# Patient Record
Sex: Female | Born: 1965 | Hispanic: Yes | Marital: Married
Health system: Midwestern US, Community
[De-identification: ages and names within clinical notes are randomized; demographics above are authoritative.]

## PROBLEM LIST (undated history)

## (undated) DIAGNOSIS — E785 Hyperlipidemia, unspecified: Secondary | ICD-10-CM

## (undated) DIAGNOSIS — K219 Gastro-esophageal reflux disease without esophagitis: Secondary | ICD-10-CM

## (undated) DIAGNOSIS — I2542 Coronary artery dissection: Secondary | ICD-10-CM

## (undated) DIAGNOSIS — F419 Anxiety disorder, unspecified: Secondary | ICD-10-CM

## (undated) DIAGNOSIS — R112 Nausea with vomiting, unspecified: Secondary | ICD-10-CM

## (undated) DIAGNOSIS — T4145XA Adverse effect of unspecified anesthetic, initial encounter: Secondary | ICD-10-CM

## (undated) DIAGNOSIS — I10 Essential (primary) hypertension: Secondary | ICD-10-CM

## (undated) DIAGNOSIS — T8859XA Other complications of anesthesia, initial encounter: Secondary | ICD-10-CM

## (undated) DIAGNOSIS — E039 Hypothyroidism, unspecified: Secondary | ICD-10-CM

## (undated) DIAGNOSIS — I219 Acute myocardial infarction, unspecified: Secondary | ICD-10-CM

## (undated) DIAGNOSIS — Z9889 Other specified postprocedural states: Secondary | ICD-10-CM

## (undated) DIAGNOSIS — E079 Disorder of thyroid, unspecified: Secondary | ICD-10-CM

## (undated) DIAGNOSIS — S46919A Strain of unspecified muscle, fascia and tendon at shoulder and upper arm level, unspecified arm, initial encounter: Secondary | ICD-10-CM

## (undated) DIAGNOSIS — Z1231 Encounter for screening mammogram for malignant neoplasm of breast: Secondary | ICD-10-CM

## (undated) DIAGNOSIS — M8580 Other specified disorders of bone density and structure, unspecified site: Secondary | ICD-10-CM

## (undated) DIAGNOSIS — R928 Other abnormal and inconclusive findings on diagnostic imaging of breast: Secondary | ICD-10-CM

## (undated) DIAGNOSIS — M654 Radial styloid tenosynovitis [de Quervain]: Secondary | ICD-10-CM

## (undated) DIAGNOSIS — M75101 Unspecified rotator cuff tear or rupture of right shoulder, not specified as traumatic: Secondary | ICD-10-CM

## (undated) HISTORY — PX: TUBAL LIGATION: SHX77

## (undated) HISTORY — DX: Disorder of thyroid, unspecified: E07.9

## (undated) HISTORY — DX: Anxiety disorder, unspecified: F41.9

## (undated) HISTORY — DX: Coronary artery dissection: I25.42

## (undated) HISTORY — DX: Acute myocardial infarction, unspecified: I21.9

## (undated) HISTORY — DX: Hyperlipidemia, unspecified: E78.5

---

## 1996-07-07 HISTORY — PX: OTHER SURGICAL HISTORY: SHX169

## 2004-04-24 ENCOUNTER — Other Ambulatory Visit: Admission: RE | Admit: 2004-04-24 | Discharge: 2004-04-24 | Payer: Self-pay | Admitting: Obstetrics and Gynecology

## 2004-07-19 ENCOUNTER — Encounter: Admission: RE | Admit: 2004-07-19 | Discharge: 2004-07-19 | Payer: Self-pay | Admitting: Sports Medicine

## 2004-07-19 ENCOUNTER — Ambulatory Visit: Payer: Self-pay | Admitting: Sports Medicine

## 2005-03-28 ENCOUNTER — Emergency Department (HOSPITAL_COMMUNITY): Admission: EM | Admit: 2005-03-28 | Discharge: 2005-03-28 | Payer: Self-pay | Admitting: Family Medicine

## 2005-04-09 ENCOUNTER — Ambulatory Visit: Payer: Self-pay | Admitting: Sports Medicine

## 2005-06-10 ENCOUNTER — Ambulatory Visit (HOSPITAL_COMMUNITY): Admission: RE | Admit: 2005-06-10 | Discharge: 2005-06-10 | Payer: Self-pay | Admitting: Orthopedic Surgery

## 2005-07-02 ENCOUNTER — Ambulatory Visit: Payer: Self-pay | Admitting: Sports Medicine

## 2012-01-28 MED ORDER — TRIMETHOPRIM-POLYMYXIN B 0.1 %-10,000 UNIT/ML EYE DROPS
10000 unit- 1 mg/mL | OPHTHALMIC | Status: AC
Start: 2012-01-28 — End: 2012-02-07

## 2012-01-28 NOTE — Progress Notes (Signed)
Subjective:   Kristina Mills is a 46 y.o. female who presents for evaluation of redness, photophobia. She has noticed the above symptoms in the bilateral eye for 2 days.  Onset was acute.   Symptoms have included discharge, photophobia, itching. Patient denies blurred vision, visual field deficit. There is a history of other family members with similar symptoms    Review of Systems  Eyes: discharge, itching    Objective:   BP 132/79   Pulse 59   Temp(Src) 98.3 ??F (36.8 ??C) (Oral)   Resp 18   Ht 5' 0.5" (1.537 m)   Wt 148 lb (67.132 kg)   BMI 28.43 kg/m2   SpO2 99%              General: alert, cooperative, no distress   Eyes:  positive findings: conjunctivae: 1+ injection or sclera erythematous bilaterally   Vision: See above   Fluorescein:  not done     Assessment/Plan:     acute conjunctivitis    1. Discussed the diagnosis and proper care of conjunctivitis.  Stressed household Presenter, broadcasting.  2. Ophthalmic drops per orders.  3. Patient instructions: contagiousness precautions  4. Risks and side effects of medications was discussed.    5. Pt advised to read written information provided by the pharmacist: yes  6. Followup as needed.    1. Conjunctivitis of both eyes      Discussed likelihood of being viral but will cover with antibiotic drop  Work not to return tomorrow

## 2012-01-28 NOTE — Patient Instructions (Signed)
Pinkeye: After Your Visit  Your Care Instructions  Pinkeye is redness and swelling of the eye surface and the conjunctiva (the lining of the eyelid and the covering of the white part of the eye). Pinkeye is also called conjunctivitis. Pinkeye is often caused by infection with bacteria or a virus. Dry air, allergies, smoke, and chemicals are other common causes.  Pinkeye often clears on its own in 7 to 10 days. Antibiotics only help if the pinkeye is caused by bacteria. Pinkeye caused by infection spreads easily. If an allergy or chemical is causing pinkeye, it will not go away unless you can avoid whatever is causing it.  Follow-up care is a key part of your treatment and safety. Be sure to make and go to all appointments, and call your doctor if you are having problems. It???s also a good idea to know your test results and keep a list of the medicines you take.  How can you care for yourself at home?  ?? Wash your hands often. Always wash them before and after you treat pinkeye or touch your eyes or face.   ?? Use moist cotton or a clean, wet cloth to remove crust. Wipe from the inside corner of the eye to the outside. Use a clean part of the cloth for each wipe.   ?? Put cold or warm wet cloths on your eye a few times a day if the eye hurts.   ?? Do not wear contact lenses or eye makeup until the pinkeye is gone. Throw away any eye makeup you were using when you got pinkeye. Clean your contacts and storage case. If you wear disposable contacts, use a new pair when your eye has cleared and it is safe to wear contacts again.   ?? If the doctor gave you antibiotic ointment or eyedrops, use them as directed. Use the medicine for as long as instructed, even if your eye starts looking better soon. Keep the bottle tip clean, and do not let it touch the eye area.   ?? To put in eyedrops or ointment:   ?? Tilt your head back, and pull your lower eyelid down with one finger.   ?? Drop or squirt the medicine inside the lower lid.    ?? Close your eye for 30 to 60 seconds to let the drops or ointment move around.   ?? Do not touch the ointment or dropper tip to your eyelashes or any other surface.   ?? Do not share towels, pillows, or washcloths while you have pinkeye.   When should you call for help?  Call your doctor now or seek immediate medical care if:  ?? You have pain in your eye, not just irritation on the surface.   ?? You have a change in vision or loss of vision.   ?? You have an increase in discharge from the eye.   ?? Your eye has not started to improve or begins to get worse within 48 hours after you start using antibiotics.   ?? Pinkeye lasts longer than 7 days.   Watch closely for changes in your health, and be sure to contact your doctor if you have any problems.    Where can you learn more?    Go to http://www.healthwise.net/[facility]   Enter Y392 in the search box to learn more about "Pinkeye: After Your Visit."    ?? 2006-2013 Health[employer], [employer]. Care instructions adapted under license by [facility] (which disclaims liability or warranty for this   information). This care instruction is for use with your licensed healthcare professional. If you have questions about a medical condition or this instruction, always ask your healthcare professional. Healthwise, Incorporated disclaims any warranty or liability for your use of this information.  Content Version: 9.7.130178; Last Revised: September 19, 2010

## 2012-01-28 NOTE — Progress Notes (Signed)
Chief Complaint   Patient presents with   ??? Eye Problem     drainage from right eye x 3 days

## 2012-03-29 ENCOUNTER — Encounter

## 2012-04-27 ENCOUNTER — Encounter

## 2012-05-06 ENCOUNTER — Encounter

## 2012-06-10 NOTE — Progress Notes (Signed)
[facility]  PREOPERATIVE INSTRUCTIONS     Surgery Date:   12/11                                          SURgery arrival time given by surgeon:no     If ???no???,[facility] staff will call you between 4 PM- 8 PM the day before surgery with    your arrival time. If your surgery is on a Monday, we will call you the preceding Friday.       Please call [phone] after 8 PM if you did not receive your arrival time.  Use our free valet parking and do not tip  1. Please report at the designated time to the 2nd Floor Admitting Desk. Bring insurance card and pictrue ID  2. You must have a responsible adult to drive you home. You need to have a responsible adult to stay with you the first 24 hours after surgery if you are going home the same day of your surgery and you should not drive a car for 24 hours following your surgery.  3. Nothing to eat or drink after midnight the night before surgery. This includes no water, gum, mints, coffee, juice, etc.  Please note special instructions, if applicable, below for medications.  4. MEDICATIONS TO TAKE THE MORNING OF SURGERY WITH A SIP OF WATER:famotidine, celexa, lisinopril   No alcoholic beverages 24 hours before or after your surgery.  5. If you are being admitted to the hospital,please leave personal belongings/luggage in your car until you have an assigned hospital room number.  6. Stop Aspirin and/or any non-steroidal anti-inflammatory drugs (i.e. Ibuprofen, Naproxen, Advil, Aleve) as directed by your surgeon.  You may take Tylenol.        Stop herbal supplements 1 week prior to  surgery.  7. If you are currently taking Plavix, Coumadin,or any other blood-thinning/anticoagulant medication contact your surgeon for instructions.  8. Please wear comfortable clothes. Wear your glasses instead of contacts. We ask that all money, jewelry and valuables be left at home. Wear no make up, particularly mascara, the day of surgery.   9.  All body piercings, rings,and jewelry need to be removed and left  at home.    Please wear your hair loose or down. Please no pony-tails, buns, or any metal hair accessories. If you shower the morning of surgery, please do not apply any lotions, powders, or deodorants afterwards.   Do not shave any body area within 24 hours of your surgery.  10. Please follow all instructions to avoid any potential surgical cancellation.  11.  Should your physical condition change, (i.e. fever, cold, flu, etc.) please notify your surgeon as soon as possible.  12. It is important to be on time. If a situation occurs where you may be delayed, please call:  [phone] / [phone] on the day of surgery.  13. The Preadmission Testing staff can be reached at [phone]..  14. Special instructions:none    The patient was contacted by phone   SHE  verbalized  understanding of all instructions does not  need reinforcement.

## 2012-06-10 NOTE — Progress Notes (Signed)
[facility]  PREOPERATIVE INSTRUCTIONS    Surgery Date:   12/11  Surgery arrival time given by surgeon: no     If ???no???,[facility] staff will call you between 4 PM- 8 PM the day before surgery with    your arrival time. If your surgery is on a Monday, we will call you the preceding Friday.       Please call [phone] after 8 PM if you did not receive your arrival time.  Please use valet parking which is free and do not tip  1. Please report at the designated time to the 2nd Floor Admitting Desk. Bring insurance card and pictrue ID  2. You must have a responsible adult to drive you home. You need to have a responsible adult to stay with you the first 24 hours after surgery if you are going home the same day of your surgery and you should not drive a car for 24 hours following your surgery.  3. Nothing to eat or drink after midnight the night before surgery. This includes no water, gum, mints, coffee, juice, etc.  Please note special instructions, if applicable, below for medications.  4. MEDICATIONS TO TAKE THE MORNING OF SURGERY WITH A SIP OF WATER:famotidine, lisinopril,celexa  5. No alcoholic beverages 24 hours before or after your surgery.  6. If you are being admitted to the hospital,please leave personal belongings/luggage in your car until you have an assigned hospital room number.  7. Stop Aspirin and/or any non-steroidal anti-inflammatory drugs (i.e. Ibuprofen, Naproxen, Advil, Aleve) as directed by your surgeon.  You may take Tylenol.        Stop herbal supplements 1 week prior to  surgery.  8. If you are currently taking Plavix, Coumadin,or any other blood-thinning/anticoagulant medication contact your surgeon for instructions.  9. Please wear comfortable clothes. Wear your glasses instead of contacts. We ask that all money, jewelry and valuables be left at home. Wear no make up, particularly mascara, the day of surgery.   10.  All body piercings, rings,and jewelry need to be removed and left at home.    Please wear  your hair loose or down. Please no pony-tails, buns, or any metal hair accessories. If you shower the morning of surgery, please do not apply any lotions, powders, or deodorants afterwards.   Do not shave any body area within 24 hours of your surgery.  11. Please follow all instructions to avoid any potential surgical cancellation.  12.  Should your physical condition change, (i.e. fever, cold, flu, etc.) please notify your surgeon as soon as possible.  13. It is important to be on time. If a situation occurs where you may be delayed, please call:  [phone] / [phone] on the day of surgery.  14. The Preadmission Testing staff can be reached at [phone]..  15. Special instructions:none    The patient was contacted  By phone.   SHE  verbalized  understanding of all instructions does not  need reinforcement.

## 2012-06-16 ENCOUNTER — Inpatient Hospital Stay: Payer: PRIVATE HEALTH INSURANCE

## 2012-06-16 MED ORDER — BACITRACIN 50,000 UNIT IM
50000 unit | INTRAMUSCULAR | Status: AC
Start: 2012-06-16 — End: ?

## 2012-06-16 MED ORDER — LACTATED RINGERS IV
INTRAVENOUS | Status: DC
Start: 2012-06-16 — End: 2012-06-16
  Administered 2012-06-16: 19:00:00 via INTRAVENOUS

## 2012-06-16 MED ORDER — SODIUM CHLORIDE 0.9 % IJ SYRG
INTRAMUSCULAR | Status: DC | PRN
Start: 2012-06-16 — End: 2012-06-16

## 2012-06-16 MED ORDER — FENTANYL CITRATE (PF) 50 MCG/ML IJ SOLN
50 mcg/mL | INTRAMUSCULAR | Status: AC
Start: 2012-06-16 — End: ?

## 2012-06-16 MED ORDER — LIDOCAINE HCL 2 % (20 MG/ML) IJ SOLN
20 mg/mL (2 %) | INTRAMUSCULAR | Status: AC
Start: 2012-06-16 — End: ?

## 2012-06-16 MED ORDER — HYDROCODONE-ACETAMINOPHEN 5 MG-325 MG TAB
5-325 mg | ORAL_TABLET | ORAL | Status: DC
Start: 2012-06-16 — End: 2014-06-26

## 2012-06-16 MED ORDER — ONDANSETRON (PF) 4 MG/2 ML INJECTION
4 mg/2 mL | INTRAMUSCULAR | Status: DC | PRN
Start: 2012-06-16 — End: 2012-06-16

## 2012-06-16 MED ORDER — BUPIVACAINE (PF) 0.25 % (2.5 MG/ML) IJ SOLN
0.25 % (2.5 mg/mL) | INTRAMUSCULAR | Status: DC | PRN
Start: 2012-06-16 — End: 2012-06-16
  Administered 2012-06-16 (×2)

## 2012-06-16 MED ORDER — PROMETHAZINE 25 MG/ML INJECTION
25 mg/mL | INTRAMUSCULAR | Status: DC | PRN
Start: 2012-06-16 — End: 2012-06-16

## 2012-06-16 MED ORDER — BUPIVACAINE (PF) 0.5 % (5 MG/ML) IJ SOLN
0.5 % (5 mg/mL) | INTRAMUSCULAR | Status: AC
Start: 2012-06-16 — End: ?

## 2012-06-16 MED ORDER — FENTANYL CITRATE (PF) 50 MCG/ML IJ SOLN
50 mcg/mL | INTRAMUSCULAR | Status: DC | PRN
Start: 2012-06-16 — End: 2012-06-16
  Administered 2012-06-16 (×2): via INTRAVENOUS

## 2012-06-16 MED ORDER — NALOXONE 0.4 MG/ML INJECTION
0.4 mg/mL | INTRAMUSCULAR | Status: DC | PRN
Start: 2012-06-16 — End: 2012-06-16

## 2012-06-16 MED ORDER — LIDOCAINE HCL 1 % (10 MG/ML) IJ SOLN
10 mg/mL (1 %) | INTRAMUSCULAR | Status: DC | PRN
Start: 2012-06-16 — End: 2012-06-16
  Administered 2012-06-16: 21:00:00 via INTRADERMAL

## 2012-06-16 MED ORDER — LACTATED RINGERS IV
INTRAVENOUS | Status: DC
Start: 2012-06-16 — End: 2012-06-16

## 2012-06-16 MED ORDER — LIDOCAINE (PF) 10 MG/ML (1 %) IJ SOLN
10 mg/mL (1 %) | INTRAMUSCULAR | Status: DC
Start: 2012-06-16 — End: 2012-06-16

## 2012-06-16 MED ORDER — SODIUM CHLORIDE 0.9 % IJ SYRG
Freq: Three times a day (TID) | INTRAMUSCULAR | Status: DC
Start: 2012-06-16 — End: 2012-06-16

## 2012-06-16 MED ORDER — CEFAZOLIN 2 GRAM/50 ML NS IVPB
Freq: Once | INTRAVENOUS | Status: AC
Start: 2012-06-16 — End: 2012-06-16
  Administered 2012-06-16: 20:00:00 via INTRAVENOUS

## 2012-06-16 MED ORDER — MIDAZOLAM 1 MG/ML IJ SOLN
1 mg/mL | INTRAMUSCULAR | Status: DC | PRN
Start: 2012-06-16 — End: 2012-06-16
  Administered 2012-06-16 (×2): via INTRAVENOUS

## 2012-06-16 MED ORDER — PROPOFOL 10 MG/ML IV EMUL
10 mg/mL | INTRAVENOUS | Status: DC | PRN
Start: 2012-06-16 — End: 2012-06-16
  Administered 2012-06-16 (×3): via INTRAVENOUS

## 2012-06-16 MED ORDER — MIDAZOLAM 1 MG/ML IJ SOLN
1 mg/mL | INTRAMUSCULAR | Status: AC
Start: 2012-06-16 — End: ?

## 2012-06-16 MED ORDER — LIDOCAINE (PF) 10 MG/ML (1 %) IJ SOLN
10 mg/mL (1 %) | INTRAMUSCULAR | Status: DC | PRN
Start: 2012-06-16 — End: 2012-06-16
  Administered 2012-06-16: 19:00:00 via SUBCUTANEOUS

## 2012-06-16 MED ORDER — HYDROMORPHONE (PF) 1 MG/ML IJ SOLN
1 mg/mL | INTRAMUSCULAR | Status: DC | PRN
Start: 2012-06-16 — End: 2012-06-16

## 2012-06-16 MED ORDER — DIPHENHYDRAMINE HCL 50 MG/ML IJ SOLN
50 mg/mL | INTRAMUSCULAR | Status: DC | PRN
Start: 2012-06-16 — End: 2012-06-16

## 2012-06-16 MED ORDER — FLUMAZENIL 0.1 MG/ML IV SOLN
0.1 mg/mL | INTRAVENOUS | Status: DC | PRN
Start: 2012-06-16 — End: 2012-06-16

## 2012-06-16 MED FILL — BACITRACIN 50,000 UNIT IM: 50000 unit | INTRAMUSCULAR | Qty: 50000

## 2012-06-16 MED FILL — CEFAZOLIN 2 GRAM/50 ML NS IVPB: INTRAVENOUS | Qty: 50

## 2012-06-16 MED FILL — XYLOCAINE-MPF 10 MG/ML (1 %) INJECTION SOLUTION: 10 mg/mL (1 %) | INTRAMUSCULAR | Qty: 5

## 2012-06-16 MED FILL — MIDAZOLAM 1 MG/ML IJ SOLN: 1 mg/mL | INTRAMUSCULAR | Qty: 5

## 2012-06-16 MED FILL — FENTANYL CITRATE (PF) 50 MCG/ML IJ SOLN: 50 mcg/mL | INTRAMUSCULAR | Qty: 2

## 2012-06-16 MED FILL — BUPIVACAINE (PF) 0.5 % (5 MG/ML) IJ SOLN: 0.5 % (5 mg/mL) | INTRAMUSCULAR | Qty: 30

## 2012-06-16 MED FILL — LACTATED RINGERS IV: INTRAVENOUS | Qty: 1000

## 2012-06-16 MED FILL — LIDOCAINE HCL 2 % (20 MG/ML) IJ SOLN: 20 mg/mL (2 %) | INTRAMUSCULAR | Qty: 20

## 2012-06-16 NOTE — H&P (Signed)
Date of Surgery Update:  Kristina Mills was seen and examined.  History and physical has been reviewed. There have been no significant clinical changes since the completion of the originally dated History and Physical.    Signed By: Johnney Ou, PA     June 16, 2012 3:10 PM

## 2012-06-16 NOTE — Anesthesia Post-Procedure Evaluation (Signed)
Post-Anesthesia Evaluation and Assessment    Patient: Kristina Mills MRN: [id number]  SSN: [id number]    Date of Birth: 1966/06/13  Age: 46 y.o.  Sex: female       Cardiovascular Function/Vital Signs  Visit Vitals   Item Reading   ??? BP 124/65   ??? Pulse 56   ??? Temp 36.7 ??C (98 ??F)   ??? Resp 14   ??? Ht 5\' 1"  (1.549 m)   ??? Wt 71.753 kg (158 lb 3 oz)   ??? BMI 29.9 kg/m2   ??? SpO2 97%       Patient is status post MAC anesthesia for Procedure(s):  LEFT DEQUERVAINS RELEASE.    Nausea/Vomiting: None    Postoperative hydration reviewed and adequate.    Pain:  Pain Scale 1: Numeric (0 - 10) (06/16/12 1603)  Pain Intensity 1: 0 (06/16/12 1627)   Managed    Neurological Status:   Neuro (WDL): Within Defined Limits (06/16/12 1627)  Neuro  Neurologic State: Drowsy (06/16/12 1603)   At baseline    Mental Status and Level of Consciousness: Alert and oriented to person, place, and time    Pulmonary Status:   O2 Device: Room air (06/16/12 1612)   Adequate oxygenation and airway patent    Complications related to anesthesia: None    Post-anesthesia assessment completed. No concerns    Signed By: Hayden Rasmussen, MD     June 16, 2012

## 2012-06-16 NOTE — Anesthesia Pre-Procedure Evaluation (Addendum)
Anesthetic History     PONV         Review of Systems / Medical History  Patient summary reviewed, nursing notes reviewed and pertinent labs reviewed    Pulmonary                 Neuro/Psych         Psychiatric history     Cardiovascular    Hypertension            Exercise tolerance: >4 METS     GI/Hepatic/Renal     GERD             Endo/Other  Within defined limits           Other Findings            Physical Exam    Airway  Mallampati: I    Neck ROM: normal range of motion   Mouth opening: Normal     Cardiovascular  Regular rate and rhythm,  S1 and S2 normal,  no murmur, click, rub, or gallop             Dental  No notable dental hx       Pulmonary  Breath sounds clear to auscultation               Abdominal  GI exam deferred       Other Findings            Anesthetic Plan    ASA: 2  Anesthesia type: MAC            Anesthetic plan and risks discussed with: Patient

## 2012-06-16 NOTE — Brief Op Note (Signed)
BRIEF OPERATIVE NOTE    Date of Procedure: 06/16/2012   Preoperative Diagnosis: LEFT DEQUARVAINS DISEASE  Postoperative Diagnosis: LEFT DEQUARVAINS DISEASE    Procedure(s):  LEFT DEQUERVAINS RELEASE  Surgeon(s) and Role:     Claiborne Rigg, MD - Primary  Assistant:  Johnney Ou PA-C  Anesthesia: MAC   Estimated Blood Loss: none  Specimens: * No specimens in log *   Findings:   Tenosynovitis left first dorsal compartment  Complications: none  Implants: * No implants in log *

## 2012-06-17 MED FILL — DIPRIVAN 10 MG/ML INTRAVENOUS EMULSION: 10 mg/mL | INTRAVENOUS | Qty: 70

## 2012-06-17 MED FILL — LACTATED RINGERS IV: INTRAVENOUS | Qty: 400

## 2012-06-25 NOTE — Op Note (Signed)
Name:      Kristina Mills, Kristina Mills                                          Surgeon:        Ian Bushman, MD  Account #: [id number]                 Surgery Date:   06/16/2012  DOB:       Jul 31, 1965  Age:       46                           Location:                                 OPERATIVE REPORT      SPECIMENS REMOVED:  none    PREOPERATIVE DIAGNOSIS: Left de Quervain's disease.    POSTOPERATIVE DIAGNOSIS: Left de Quervain's disease.    PROCEDURE PERFORMED: Left first dorsal compartment release.    SURGEON: T. Volney Presser, MD.    ASSISTANT: Corinna Capra. Alvino Chapel, [place].    ANESTHESIA: IV sedation with radial nerve block, left forearm.    COMPLICATIONS: None.    ESTIMATED BLOOD LOSS: 0.    TOURNIQUET TIME: 10 minutes.    DESCRIPTION OF PROCEDURE: The patient was taken to the operating room,  placed supine on the operating table. Following administration of IV  sedation, a radial nerve block to the left forearm was performed with 0.5%  Marcaine, 1% lidocaine half and half, approximately 10 mL. Left hand and  arm were prepped and draped in sterile fashion with Hibiclens and alcohol,  and tourniquet applied to the left proximal arm. The arm was exsanguinated  with an Esmarch bandage and tourniquet inflated to 250 mmHg.    An incision was made longitudinally over the first dorsal compartment of  the wrist. Subcutaneous dissection was carried out and superficial radial  nerve branches identified and protected. The first dorsal compartment was  identified and completely released, exposing the abductor pollicis longus  and extensor pollicis brevis tendons, which were completely released of any  tenosynovium that was present. The wound was copiously irrigated with  sterile saline, hemostasis achieved with bipolar cautery. No subluxation of  the tendons was noted with wrist flexion and extension. The wound was then  repaired using 4-0 Vicryl interrupted inverted subcutaneous and 5-0 Prolene  running subcuticular with  benzoin and Steri-Strips on skin. A sterile bulky  soft thumb spica dressing was applied, tourniquet let down, patient  awakened from anesthesia.    The patient was discharged to the recovery room in stable condition.    DISCHARGE PLAN: The patient is instructed to keep her arm elevated, clean  and dry at all times, to call with any questions or concerns, and followup  in 10 days for suture removal, wound check, and hand therapy referral. She  was given a prescription for Vicodin 1 to 2 q.4h hours p.r.n. for pain.          Ian Bushman, MD    cc:   Ian Bushman, MD        TPM/wmx; D: 06/24/2012 04:56 P; T: 06/25/2012 06:18 A; Doc# [id number]; Job#  [id number]

## 2012-06-25 NOTE — Op Note (Signed)
Name:      Kristina Mills, Kristina Mills                                          Surgeon:        Dayani Winbush Paul Graci Hulce,   Jr, MD  Account #: [id number]                 Surgery Date:   06/16/2012  DOB:       05/13/1966  Age:       46                           Location:                                 OPERATIVE REPORT      SPECIMENS REMOVED:  none    PREOPERATIVE DIAGNOSIS: Left de Quervain's disease.    POSTOPERATIVE DIAGNOSIS: Left de Quervain's disease.    PROCEDURE PERFORMED: Left first dorsal compartment release.    SURGEON: T. Paul Letishia Elliott, MD.    ASSISTANT: Rebecca M. Ellen, PA.    ANESTHESIA: IV sedation with radial nerve block, left forearm.    COMPLICATIONS: None.    ESTIMATED BLOOD LOSS: 0.    TOURNIQUET TIME: 10 minutes.    DESCRIPTION OF PROCEDURE: The patient was taken to the operating room,  placed supine on the operating table. Following administration of IV  sedation, a radial nerve block to the left forearm was performed with 0.5%  Marcaine, 1% lidocaine half and half, approximately 10 mL. Left hand and  arm were prepped and draped in sterile fashion with Hibiclens and alcohol,  and tourniquet applied to the left proximal arm. The arm was exsanguinated  with an Esmarch bandage and tourniquet inflated to 250 mmHg.    An incision was made longitudinally over the first dorsal compartment of  the wrist. Subcutaneous dissection was carried out and superficial radial  nerve branches identified and protected. The first dorsal compartment was  identified and completely released, exposing the abductor pollicis longus  and extensor pollicis brevis tendons, which were completely released of any  tenosynovium that was present. The wound was copiously irrigated with  sterile saline, hemostasis achieved with bipolar cautery. No subluxation of  the tendons was noted with wrist flexion and extension. The wound was then  repaired using 4-0 Vicryl interrupted inverted subcutaneous and 5-0 Prolene  running subcuticular with  benzoin and Steri-Strips on skin. A sterile bulky  soft thumb spica dressing was applied, tourniquet let down, patient  awakened from anesthesia.    The patient was discharged to the recovery room in stable condition.    DISCHARGE PLAN: The patient is instructed to keep her arm elevated, clean  and dry at all times, to call with any questions or concerns, and followup  in 10 days for suture removal, wound check, and hand therapy referral. She  was given a prescription for Vicodin 1 to 2 q.4h hours p.r.n. for pain.          Latora Quarry Paul Kyuss Hale, Jr, MD    cc:   Tanea Moga Paul Kourosh Jablonsky, Jr, MD        TPM/wmx; D: 06/24/2012 04:56 P; T: 06/25/2012 06:18 A; Doc# [id number]; Job#  [id number]

## 2013-01-10 ENCOUNTER — Encounter

## 2013-10-26 ENCOUNTER — Encounter

## 2014-05-01 ENCOUNTER — Inpatient Hospital Stay: Admit: 2014-05-01 | Payer: Worker's Compensation | Attending: Sports Medicine | Primary: Internal Medicine

## 2014-05-01 DIAGNOSIS — M25511 Pain in right shoulder: Secondary | ICD-10-CM

## 2014-05-01 NOTE — Progress Notes (Signed)
PT INITIAL EVALUATION NOTE 2-15    Patient Name: Kristina Mills  Date:05/01/2014  DOB: 15-Jul-1965    Patient DOB Verified  Payor: GENERIC WORKERS COMPENSATION / [facility] GENERIC WORKERS [facility]ENS[facility] / Product Type: Workers Comp /    In time: 7:00  Out time: 8:00  Total Treatment Time (min): 60  Visit #: 1  Treatment Area: Right shoulder pain [M25.511]    SUBJECTIVE  Pain Level (0-10 scale):  3/10 Sitting  6/10 with reaching  Any medication changes, allergies to medications, adverse drug reactions, diagnosis change, or new procedure performed?:  No     Yes (see summary sheet for update)  Subjective:     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  On March 30, 2014 she was helping a Consulting civil engineercoworker lift a heavy instrument off a high shelf, she felt immediate R shoulder pain.  Pain was severe for 2 weeks, she treated with alleve, rest and ice.  She reports pain has improved since initial onset but she still has pain with reaching above chest level, using her arm to scrub down surgical area, pulling surgical cart, reaching for surgical instruments with the R hand if she is stabilizing with the L, dressing upper body.   She notices pain and popping with certain movements.  She is R hand dominant.  She has no prior history of R shoulder pain.   She denies parasthesias.   She is also experiencing neck pain/tighness on the R.    She is concerned today because pain is not resolving and she still struggles with functional tasks.       OBJECTIVE     Posture:  Mild forward shoulder rounded shoulders, forward head.   Palpation: Tender to palpation anterior capsule, supraspinatus.   Increased ms turgor R levator, upper trap, cervical paraspinals, elevated R 1st rib compared to L.   Joint Mobility: Tight posterior and inferior capsule with joint mobs  Scapulohumoral Control / Rhythm:   Able to eccentrically lower with good control? Left:  Yes   No         Right:  Yes   No (pain)     Shoulder AROM:         R     L  Shoulder Flexion  R 160 pain, shoulder hike  L Full       Shoulder Abduction   R 155 pain, shoulder hike  L Full  Shoulder Extension  R Full w/ pain endrange  L Full  Shoulder ER   R 65 deg at 90 deg abd guarded        L  Full    Shoulder IR   R Thumb to waistband P!                L Thumb to bra strap painfree     Shoulder PROM:   Shoulder Flexion   R 165, pain guarded, empty   Pain returning to neutral  Shoulder Abduction  R 160, pain guarded, empty  Shoulder ER @ 90 abd R 70, pain garded, empty   Pain returning to neutral                 MMT:   Shoulder Flexion   3/5 Pain  Shoulder ER   3/5 Pain  Shoulder IR   4/5 Pain      Special Tests:   Empty/Full Can Test:  (-)  Drop Arm:  (-)    Apprehension: Ant:  ( + )        Modality rationale: decrease inflammation, decrease pain and increase tissue extensibility to improve the patient???s ability to control symptoms   Min Type Additional Details                       15   Ice       Heat    Ice massage Position:  Supine  Location:  R shoulder  Game Ready Cryocuff              Skin assessment post-treatment:  intact redness- no adverse reaction    redness ??? adverse reaction:     15 min Therapeutic Exercise:   See flow sheet :   Rationale: increase ROM, increase strength and improve GH alignment and stability to improve the patient???s ability to control symptoms, perform ADLs.       15 min Manual Therapy:  Inferior/posterior humeral head mobs.    Rationale: decrease pain, increase ROM, decrease trigger points, increase postural awareness and improve GH alignment  to improve the patient???s ability to control symptoms, perform ADLs.      With    TE    TA    neuro    other: Patient Education:  Review HEP     Progressed/Changed HEP based on:    positioning    body mechanics    transfers    heat/ice application     other:  Written/Pictoral HEP provided, see chart       Other Objective/Functional Measures:  Good understanding of initial HEP     Pain Level (0-10 scale) post treatment:  2/10    ASSESSMENT:     Patient demonstrates signs and symptoms consistent with R GH instability and impingement, she will benefit from skilled PT to address functional limitations and achieve goals.     See Plan of care.     Patient will continue to benefit from skilled PT services to modify and progress therapeutic interventions, address functional mobility deficits, address ROM deficits, address strength deficits, analyze and address soft tissue restrictions, analyze and cue movement patterns, analyze and modify body mechanics/ergonomics and assess and modify postural abnormalities to attain remaining goals.       See Plan of Care    See progress note/recertification    See Discharge Summary    PLAN    Upgrade activities as tolerated       Continue plan of care    Update interventions per flow sheet         Discharge due to:_    Other:_      Leotis Painobyn L Iantha Titsworth, PT, MSPT 05/01/2014  7:12 AM

## 2014-05-01 NOTE — Progress Notes (Signed)
[facility]  [address], [address]  [place], [place] [id number]  Phone: [phone]  Fax: [phone]    Plan of Care/ Statement of Necessity for Physical Therapy Services 2-15    Patient name: Kristina Mills  DOB: 1966/01/22  Provider#: [id number]  Referral source: Donnelly StagerWesdock, James, MD      Medical/Treatment Diagnosis: Right shoulder pain [M25.511]     Prior Hospitalization: see medical history     Comorbidities: -  Prior Level of Function: Painfree/full shoulder function  Medications: Verified on Patient Summary List    Start of Care:05/01/2014      Onset Date: 03/30/14      The Plan of Care and following information is based on the information from the initial evaluation.    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  On March 30, 2014 she was helping a Consulting civil engineercoworker lift a heavy instrument off a high shelf, she felt immediate R shoulder pain.  Pain was severe for 2 weeks, she treated with alleve, rest and ice.  She reports pain has improved since initial onset but she still has pain with reaching above chest level, using her arm to scrub down surgical area, pulling surgical cart, reaching for surgical instruments with the R hand if she is stabilizing with the L, dressing upper body.   She notices pain and popping with certain movements.  She is R hand dominant.  She has no prior history of R shoulder pain.   She denies parasthesias.   She is also experiencing neck pain/tighness on the R.    She is concerned today because pain is not resolving and she still struggles with functional tasks.      She presents today with signs and symptoms consistent with R GH shoulder instability, guarding/apprehension and crepitus, impingement with elevation, RTC weakness and discoordination.  She will benefit from skilled PT to address functional limitations and achieve goals.      Problem List: pain affecting function, decrease ROM, decrease strength, decrease ADL/ functional abilitiies, decrease activity tolerance and decrease flexibility/ joint mobility   Treatment Plan may include any combination of the following: Therapeutic exercise, Therapeutic activities, Neuromuscular re-education, Physical agent/modality, Manual therapy and Patient education  Patient / Family readiness to learn indicated by: asking questions, trying to perform skills and interest  Persons(s) to be included in education: patient (P)  Barriers to Learning/Limitations: no  Patient Goal (s): ???strengthen my arm again???  Patient Self Reported Health Status: good  Rehabilitation Potential: good    Short Term Goals: To be accomplished in 4  weeks:  1) Patient will demonstrate good understanding of HEP  2) Patient will demonstrate good understanding of sitting/standing/work postures to improve GH alignment/function  3) Patient will report no R shoulder pain at rest, sleep without waking due to pain.   4) Patient will have full passive R shoulder ROM without pain.    Long Term Goals: To be accomplished in 8 weeks:  1) Patient will demonstrate full active R shoulder ROM without pain in order to perform work reaching activities.  2) Patient will demonstrate good R RTC strength at 4+/5 in order to perform ADL's/work activities.  3) Patient will report no pain with reaching, scrubbing, dressing activities.   4) Patient will discharge to progressive  HEP.     Frequency / Duration: Patient to be seen 2-3 times per week for 8  weeks.  Patient/ Caregiver education and instruction: activity modification and exercises      Plan of care has been reviewed with PTA    Leotis Painobyn L Tim Wilhide, PT, MSPT  05/01/2014 11:07 AM    ________________________________________________________________________    I certify that the above Therapy Services are being furnished while the patient is under my care. I agree with the treatment plan and certify that  this therapy is necessary.    Physician's Signature:____________________  Date:____________Time: _________

## 2014-05-03 ENCOUNTER — Inpatient Hospital Stay: Admit: 2014-05-03 | Payer: Worker's Compensation | Attending: Sports Medicine | Primary: Internal Medicine

## 2014-05-03 NOTE — Progress Notes (Signed)
PT DAILY TREATMENT NOTE 2-15    Patient Name: Kristina GeraldsRita Mills  Date:05/03/2014  DOB: March 31, 1966    Patient DOB Verified  Payor: GENERIC WORKERS COMPENSATION / [facility] GENERIC WORKERS [facility] / Product Type: Workers Comp /    In time: 7:05   Out time: 8:15   Total Treatment Time (min): 70  Visit #: 2    Treatment Area: Right shoulder pain [M25.511]    SUBJECTIVE  Pain Level (0-10 scale): 4/10  Any medication changes, allergies to medications, adverse drug reactions, diagnosis change, or new procedure performed?:  No     Yes (see summary sheet for update)  Subjective functional status/changes:    No changes reported  Had increased pain after assisting with a surgery yesterday wearing a lead apron.  The patient was elevated to chest level- she had to reach higher than usual to assist surgeon. Had some sharp pain with reaching movements.  Soreness increased today in the R shoulder and also ms tighness/guarding in the R cervical spine.     OBJECTIVE    Modality rationale: decrease edema, decrease inflammation, decrease pain and increase tissue extensibility to improve the patient???s ability to control symptoms, perform ADLs   Min Type Additional Details             15 Game Ready Cryocuff following treatment R shoulder        15   Ice       Heat    Ice massage Position: supine  Location: cervical       30 min Therapeutic Exercise:   See flow sheet :   Rationale: increase ROM, increase strength and decrease impingment, stabilize shoulder to improve the patient???s ability to perform ADL's, control symptoms.      15 min Manual Therapy:  Manual cervical traction, STM R cervical, 1st rib inferior mobs, R shoulder PROM   Rationale: decrease pain, increase ROM, increase tissue extensibility, decrease trigger points and increase postural awareness  to improve the patient???s ability to control symptoms, perform ADLs.            With    TE    TA    neuro    other: Patient Education:  Review HEP      Progressed/Changed HEP based on:    positioning    body mechanics    transfers    heat/ice application     other:      Other Objective/Functional Measures:     Noted elevated 1st rib Right with increased ms turgor to cervical musculature.   Improved following STM to R cervical musculature, manual stretch and 1st rib mobs.     R shoulder painful arc from 85-130 flexion.   Painful pop and shift with moving from flexion to neutral PROM.  Complained of brief"locking" when moving from flex to neutral in supine.     Pain Level (0-10 scale) post treatment: 3/10    ASSESSMENT/Changes in Function:     Patient will continue to benefit from skilled PT services to modify and progress therapeutic interventions, address functional mobility deficits, address ROM deficits, address strength deficits, analyze and address soft tissue restrictions, analyze and cue movement patterns, analyze and modify body mechanics/ergonomics and assess and modify postural abnormalities to attain remaining goals.       See Plan of Care    See progress note/recertification    See Discharge Summary         Progress towards goals / Updated goals:  Good initial response  to therapy.  She demonstrated good understanding of HEP issued last visit.      PLAN    Upgrade activities as tolerated       Continue plan of care    Update interventions per flow sheet         Discharge due to:_    Other:_      Leotis Painobyn L Sheryle Vice, PT, MSPT 05/03/2014  7:13 AM

## 2014-05-05 ENCOUNTER — Inpatient Hospital Stay: Admit: 2014-05-05 | Payer: Worker's Compensation | Attending: Sports Medicine | Primary: Internal Medicine

## 2014-05-05 NOTE — Progress Notes (Signed)
PT DAILY TREATMENT NOTE 2-15    Patient Name: Kristina Mills  Date:05/05/2014  DOB: May 29, 1966    Patient DOB Verified  Payor: GENERIC WORKERS COMPENSATION / [facility] GENERIC [facility]ERS [facility] / Product Type: Workers Comp /    In time: 7:00  Out time:8:00  Total Treatment Time (min): 60  Visit #: 3     Treatment Area: Right shoulder pain [M25.511]    SUBJECTIVE  Pain Level (0-10 scale): 2/10  Any medication changes, allergies to medications, adverse drug reactions, diagnosis change, or new procedure performed?:  No     Yes (see summary sheet for update)  Subjective functional status/changes:    No changes reported  Has noted some improvement in R shoulder pain at rest.  Still having popping and pain with reaching.     OBJECTIVE      45 min Therapeutic Exercise:   See flow sheet :  Advanced RTC and scapular strengthening exs.    Rationale: increase ROM, increase strength and improve coordination to improve the patient???s ability to control symptoms, perform ADLs.       15 min Manual Therapy:  Inferior/posterior humeral head mobs with PROM flexion.   Manual shoulder stab work.     Rationale: decrease pain, increase ROM and increase postural awareness  to improve the patient???s ability to improve GH mechanics and stability in order to control symptoms and perform ADLs.      With    TE    TA    neuro    other: Patient Education:  Review HEP     Progressed/Changed HEP based on:    positioning    body mechanics    transfers    heat/ice application     other: Heavy emphasis on posture and scapular position for optimal GH function.      Other Objective/Functional Measures: Noted several instances of shift and clunk with pain moving from flexion to neutral with PROM and AROM.  Improved with scap retraction and manual support.   Tight inferior and posterior capsule R.   Visibly less pain today     Pain Level (0-10 scale) post treatment: 1/10    ASSESSMENT/Changes in Function:      Patient will continue to benefit from skilled PT services to modify and progress therapeutic interventions, address functional mobility deficits, address ROM deficits, address strength deficits, analyze and address soft tissue restrictions, analyze and cue movement patterns, analyze and modify body mechanics/ergonomics and assess and modify postural abnormalities to attain remaining goals.       See Plan of Care    See progress note/recertification    See Discharge Summary         Progress towards goals / Updated goals:    Able to advance exercises today with good tolerance.  Notes decreased pain at rest.   PLAN    Upgrade activities as tolerated       Continue plan of care    Update interventions per flow sheet         Discharge due to:_    Other:_      Leotis Painobyn L Dekota Shenk, PT, MSPT 05/05/2014  8:47 AM

## 2014-05-08 ENCOUNTER — Encounter: Payer: Worker's Compensation | Attending: Sports Medicine | Primary: Internal Medicine

## 2014-05-10 ENCOUNTER — Inpatient Hospital Stay: Admit: 2014-05-10 | Payer: Worker's Compensation | Attending: Sports Medicine | Primary: Internal Medicine

## 2014-05-10 DIAGNOSIS — M25511 Pain in right shoulder: Secondary | ICD-10-CM

## 2014-05-10 NOTE — Progress Notes (Signed)
PT DAILY TREATMENT NOTE 2-15    Patient Name: Kristina Mills  Date:05/10/2014  DOB: 1966/06/11    Patient DOB Verified  Payor: GENERIC WORKERS COMPENSATION / [facility] GENERIC WORKERS [facility] / Product Type: Workers Comp /    In time: 7:00   Out time: 8:00  Total Treatment Time (min): 60  Visit #: 4     Treatment Area: Right shoulder pain [M25.511]    SUBJECTIVE  Pain Level (0-10 scale): 4/10  Any medication changes, allergies to medications, adverse drug reactions, diagnosis change, or new procedure performed?:  No     Yes (see summary sheet for update)  Subjective functional status/changes:    No changes reported  Flared up since the weekend.  [place] and had a significant increase in pain that day and night.  Still notes popping and dull radiating ache pain into area of deltoid insertion.  She does feel that her internal rotation ROM has improved and she is better able to dress her upper body with less pain.  She is never completely painfree.     OBJECTIVE    Modality rationale: decrease inflammation and decrease pain to improve the patient???s ability to perform reaching ADL's, manage symptoms   Min Type Additional Details     Estim: Att   Unatt        TENS instruct                  IFC  Premod   NMES                     Other:  w/US   w/ice   w/heat  Position:  Location:      Traction:  Cervical       Lumbar                        Prone          Supine                       Intermittent   Continuous Lbs:   before manual   after manual  w/heat      Ultrasound: Continuous    Pulsed at:                            1MHz   3MHz Location:  W/cm2:      Paraffin         Location:  w/heat      Ice       Heat    Ice massage Position:  Location:      Laser    Other: Position:  Location:   15   Vasopneumatic Device Pressure:        lo  med  hi   Temperature: 35    Skin assessment post-treatment:  intact redness- no adverse reaction    redness ??? adverse reaction:      45 min Therapeutic Exercise:   See flow sheet : Advanced shoulder stability exs with good tolerance today.   Rationale: increase ROM, increase strength and improve GH alignment and function. to improve the patient???s ability to control symptoms, perform ADLs.      15 min Manual Therapy:     Rationale: decrease pain, increase ROM and decrease trigger points  to improve the patient???s ability to control symptoms.  With    TE    TA    neuro    other: Patient Education:  Review HEP     Progressed/Changed HEP based on: improved stability at shoulder   positioning    body mechanics    transfers    heat/ice application     other:      Other Objective/Functional Measures: Demonstrates good understanding of HEP.  Has better scapular control and understanding of GH mechanics with exercises.  Note less popping and pain with     Pain Level (0-10 scale) post treatment: 2/10    ASSESSMENT/Changes in Function:     Able to advance stability exs in the clinic and on HEP.    Patient will continue to benefit from skilled PT services to modify and progress therapeutic interventions, address functional mobility deficits, address ROM deficits, address strength deficits, analyze and address soft tissue restrictions, analyze and cue movement patterns and analyze and modify body mechanics/ergonomics to attain remaining goals.       See Plan of Care    See progress note/recertification    See Discharge Summary         Progress towards goals / Updated goals:  No goal changes    PLAN    Upgrade activities as tolerated       Continue plan of care    Update interventions per flow sheet         Discharge due to:_    Other:_      Leotis Painobyn L Vondell Babers, PT, MSPT 05/10/2014  7:46 AM

## 2014-05-12 ENCOUNTER — Inpatient Hospital Stay: Admit: 2014-05-12 | Payer: Worker's Compensation | Attending: Sports Medicine | Primary: Internal Medicine

## 2014-05-12 NOTE — Progress Notes (Signed)
PT DAILY TREATMENT NOTE 2-15    Patient Name: Jodi GeraldsRita Icard  Date:05/12/2014  DOB: [birth year]    Patient DOB Verified  Payor: GENERIC WORKERS COMPENSATION / [facility] GENERIC WORKERS [facility]ENS[facility] / Product Type: Workers Comp /    In time:  7:00  Out time: 8:00  Total Treatment Time (min): 60  Visit #:     Treatment Area: Right shoulder pain [M25.511]    SUBJECTIVE  Pain Level (0-10 scale): 3/10  Any medication changes, allergies to medications, adverse drug reactions, diagnosis change, or new procedure performed?:  No     Yes (see summary sheet for update)  Subjective functional status/changes:    No changes reported  Still has pain with reaching and lifting at work.  Is able to position herself better to reduce symptoms, has better understanding of posture and body mechanics to reduce pain.   Popping has improved.     OBJECTIVE    Modality rationale: decrease edema, decrease inflammation and decrease pain to improve the patient???s ability to tolerate ADLs, control symptoms   Min Type Additional Details     Estim: Att   Unatt        TENS instruct                  IFC  Premod   NMES                     Other:  w/US   w/ice   w/heat  Position:  Location:      Traction:  Cervical       Lumbar                        Prone          Supine                       Intermittent   Continuous Lbs:   before manual   after manual  w/heat      Ultrasound: Continuous    Pulsed at:                            1MHz   3MHz Location:  W/cm2:      Paraffin         Location:  w/heat      Ice       Heat    Ice massage Position:  Location:      Laser    Other: Position:  Location:   15   Vasopneumatic Device Pressure:        lo  med  hi   Temperature: 35    Skin assessment post-treatment:  intact redness- no adverse reaction    redness ??? adverse reaction:     45 min Therapeutic Exercise:   See flow sheet :   Rationale: increase ROM and increase strength to improve the patient???s ability to reach and lift       15 min Manual Therapy:  Inferior/posterior glide GH   Rationale: decrease pain, increase ROM and increase tissue extensibility  to improve the patient???s ability to control symptoms           With    TE    TA    neuro    other: Patient Education:  Review HEP     Progressed/Changed HEP based on:    positioning    body  mechanics    transfers    heat/ice application     other:      Other Objective/Functional Measures: Improved postural habits    Pain Level (0-10 scale) post treatment: 0/10    ASSESSMENT/Changes in Function:     Patient will continue to benefit from skilled PT services to modify and progress therapeutic interventions, address functional mobility deficits, address ROM deficits, address strength deficits, analyze and address soft tissue restrictions, analyze and cue movement patterns, analyze and modify body mechanics/ergonomics and assess and modify postural abnormalities to attain remaining goals.       See Plan of Care    See progress note/recertification    See Discharge Summary         Progress towards goals / Updated goals:  Improved postural awareness and symptom control    PLAN    Upgrade activities as tolerated       Continue plan of care    Update interventions per flow sheet         Discharge due to:_    Other:_      Leotis Painobyn L Laylani Pudwill, PT, MSPT 05/12/2014  1:36 PM

## 2014-05-15 ENCOUNTER — Inpatient Hospital Stay: Admit: 2014-05-15 | Payer: Worker's Compensation | Attending: Sports Medicine | Primary: Internal Medicine

## 2014-05-15 NOTE — Progress Notes (Signed)
PT DAILY TREATMENT NOTE - MCR 2-15    Patient Name: Kristina GeraldsRita Mills  Date:05/15/2014  DOB: 1966/04/26    Patient DOB Verified  Payor: GENERIC WORKERS COMPENSATION / [facility] / Product Type: Workers Occupational hygienistComp /    In time:705 am  Out time:810 am  Total Treatment Time (min): 65  Total Timed Codes (min): 50  1:1 Treatment Time ([facility] only): -   Visit #: 6     Treatment Area: Right shoulder pain [M25.511]    SUBJECTIVE  Pain Level (0-10 scale): 3/10  Any medication changes, allergies to medications, adverse drug reactions, diagnosis change, or new procedure performed?:  No     Yes (see summary sheet for update)  Subjective functional status/changes:    No changes reported  Pt reported that she is doing a little better and is having less pain with movement    OBJECTIVE    Modality rationale: decrease inflammation and decrease pain to improve the patient???s ability to ADLs, lift and carry, work tasks without pain   Min Type Additional Details   15  Estim: Att   Unatt        TENS instruct                  IFC  Premod   NMES                     Other:  w/US   w/GR   w/heat  Position: supine  Location: R shoulder      Traction:  Cervical       Lumbar                        Prone          Supine                       Intermittent   Continuous Lbs:   before manual   after manual  w/heat      Ultrasound: Continuous    Pulsed at:                           1MHz   3MHz Location:  W/cm2:     Paraffin         Location:   w/heat      Ice       Heat    Ice massage Position:  Location:      Laser    Other: Position:  Location:        Vasopneumatic Device Pressure:        lo  med  hi   Temperature:       Skin assessment post-treatment:  intact redness- no adverse reaction    redness ??? adverse reaction:     40 min Therapeutic Exercise:   See flow sheet :   Rationale: increase ROM, increase strength and improve coordination to improve the patient???s ability to ADLs, reach overhead, lift and carry     10 min Manual Therapy:  Inf and P/A GH jt mobs gd III/IV, PROM to tolerance   Rationale: decrease pain, increase ROM and increase tissue extensibility to improve the patient???s ability to ADLs, lift and carry, reach overhead          With    TE    TA    neuro    other: Patient Education:  Review  HEP     Progressed/Changed HEP based on:    positioning    body mechanics    transfers    heat/ice application     other:      Other Objective/Functional Measures:   Pt with painful end range PROM which could be due to muscle guarding during manual. Pt with full PROM on pulleys.     Pain Level (0-10 scale) post treatment: 1/10    ASSESSMENT/Changes in Function:     Patient will continue to benefit from skilled PT services to modify and progress therapeutic interventions, address functional mobility deficits, address ROM deficits, address strength deficits, analyze and address soft tissue restrictions, analyze and cue movement patterns, analyze and modify body mechanics/ergonomics and assess and modify postural abnormalities to attain remaining goals.       See Plan of Care    See progress note/recertification    See Discharge Summary         Progress towards goals / Updated goals:  Pt is progressing well towards goals and presents with R>L anterior shoulder/pec tightness.    PLAN    Upgrade activities as tolerated       Continue plan of care    Update interventions per flow sheet         Discharge due to:_    Other:_      Aeralyn Barna [facility], PTA, CPT 05/15/2014  7:57 AM

## 2014-05-17 ENCOUNTER — Encounter: Payer: Worker's Compensation | Attending: Sports Medicine | Primary: Internal Medicine

## 2014-05-22 ENCOUNTER — Inpatient Hospital Stay: Payer: Worker's Compensation | Attending: Sports Medicine | Primary: Internal Medicine

## 2014-05-26 ENCOUNTER — Encounter: Payer: Worker's Compensation | Attending: Sports Medicine | Primary: Internal Medicine

## 2014-06-08 ENCOUNTER — Encounter

## 2014-06-09 ENCOUNTER — Inpatient Hospital Stay: Admit: 2014-06-09 | Payer: Worker's Compensation | Attending: Family Medicine | Primary: Internal Medicine

## 2014-06-09 DIAGNOSIS — M75101 Unspecified rotator cuff tear or rupture of right shoulder, not specified as traumatic: Secondary | ICD-10-CM

## 2014-06-26 ENCOUNTER — Inpatient Hospital Stay: Payer: Worker's Compensation

## 2014-06-26 LAB — ECG RHYTHM ANALYSIS ADULT
Atrial Rate: 60 {beats}/min
Calculated P Axis: 9 degrees
Calculated R Axis: -13 degrees
Calculated T Axis: 24 degrees
Diagnosis: NORMAL
P-R Interval: 162 ms
Q-T Interval: 440 ms
QRS Duration: 90 ms
QTC Calculation (Bezet): 440 ms
Ventricular Rate: 60 {beats}/min

## 2014-06-26 LAB — METABOLIC PANEL, BASIC
Anion gap: 11 mmol/L (ref 5–15)
BUN/Creatinine ratio: 15 (ref 12–20)
BUN: 12 MG/DL (ref 6–20)
CO2: 25 mmol/L (ref 21–32)
Calcium: 8.5 MG/DL (ref 8.5–10.1)
Chloride: 106 mmol/L (ref 97–108)
Creatinine: 0.79 MG/DL (ref 0.55–1.02)
GFR est AA: >60 mL/min/{1.73_m2} (ref >60)
GFR est non-AA: >60 mL/min/{1.73_m2} (ref >60)
Glucose: 116 mg/dL — ABNORMAL HIGH (ref 65–100)
Potassium: 3.9 mmol/L (ref 3.5–5.1)
Sodium: 142 mmol/L (ref 136–145)

## 2014-06-26 MED ORDER — TRIMETHOPRIM-SULFAMETHOXAZOLE 160 MG-800 MG TAB
160-800 mg | ORAL_TABLET | Freq: Two times a day (BID) | ORAL | Status: AC
Start: 2014-06-26 — End: 2014-06-29

## 2014-06-26 MED ORDER — LACTATED RINGERS IV
INTRAVENOUS | Status: DC
Start: 2014-06-26 — End: 2014-06-26

## 2014-06-26 MED ORDER — SODIUM CHLORIDE 0.9 % IJ SYRG
Freq: Three times a day (TID) | INTRAMUSCULAR | Status: DC
Start: 2014-06-26 — End: 2014-06-26
  Administered 2014-06-26: 14:00:00 via INTRAVENOUS

## 2014-06-26 MED ORDER — SODIUM CHLORIDE 0.9 % IJ SYRG
INTRAMUSCULAR | Status: DC | PRN
Start: 2014-06-26 — End: 2014-06-26

## 2014-06-26 MED ORDER — LACTATED RINGERS IV
INTRAVENOUS | Status: DC
Start: 2014-06-26 — End: 2014-06-26
  Administered 2014-06-26: 14:00:00 via INTRAVENOUS

## 2014-06-26 MED ORDER — DIPHENHYDRAMINE HCL 50 MG/ML IJ SOLN
50 mg/mL | INTRAMUSCULAR | Status: DC | PRN
Start: 2014-06-26 — End: 2014-06-26

## 2014-06-26 MED ORDER — LIDOCAINE (PF) 10 MG/ML (1 %) IJ SOLN
10 mg/mL (1 %) | INTRAMUSCULAR | Status: DC | PRN
Start: 2014-06-26 — End: 2014-06-26

## 2014-06-26 MED ORDER — EPINEPHRINE (PF) 1 MG/ML INJECTION
1 mg/mL ( mL) | INTRAMUSCULAR | Status: DC | PRN
Start: 2014-06-26 — End: 2014-06-26
  Administered 2014-06-26: 16:00:00

## 2014-06-26 MED ORDER — MIDAZOLAM 1 MG/ML IJ SOLN
1 mg/mL | INTRAMUSCULAR | Status: DC | PRN
Start: 2014-06-26 — End: 2014-06-26
  Administered 2014-06-26 (×2): via INTRAVENOUS

## 2014-06-26 MED ORDER — LACTATED RINGERS IV
INTRAVENOUS | Status: DC
Start: 2014-06-26 — End: 2014-06-26
  Administered 2014-06-26: 18:00:00 via INTRAVENOUS

## 2014-06-26 MED ORDER — DEXAMETHASONE SODIUM PHOSPHATE 4 MG/ML IJ SOLN
4 mg/mL | INTRAMUSCULAR | Status: AC
Start: 2014-06-26 — End: ?

## 2014-06-26 MED ORDER — OXYCODONE-ACETAMINOPHEN 5 MG-325 MG TAB
5-325 mg | ORAL_TABLET | ORAL | Status: AC | PRN
Start: 2014-06-26 — End: ?

## 2014-06-26 MED ORDER — ONDANSETRON (PF) 4 MG/2 ML INJECTION
4 mg/2 mL | INTRAMUSCULAR | Status: DC | PRN
Start: 2014-06-26 — End: 2014-06-26

## 2014-06-26 MED ORDER — BACITRACIN 50,000 UNIT IM
50000 unit | INTRAMUSCULAR | Status: AC
Start: 2014-06-26 — End: ?

## 2014-06-26 MED ORDER — BUPIVACAINE-EPINEPHRINE (PF) 0.5 %-1:200,000 IJ SOLN
0.5 %-1:200,000 | INTRAMUSCULAR | Status: AC
Start: 2014-06-26 — End: ?

## 2014-06-26 MED ORDER — ROPIVACAINE (PF) 5 MG/ML (0.5 %) INJECTION
5 mg/mL (0. %) | INTRAMUSCULAR | Status: DC | PRN
Start: 2014-06-26 — End: 2014-06-26
  Administered 2014-06-26: 15:00:00 via PERINEURAL

## 2014-06-26 MED ORDER — PROPOFOL 10 MG/ML IV EMUL
10 mg/mL | INTRAVENOUS | Status: AC
Start: 2014-06-26 — End: ?

## 2014-06-26 MED ORDER — HYDROMORPHONE (PF) 1 MG/ML IJ SOLN
1 mg/mL | INTRAMUSCULAR | Status: DC | PRN
Start: 2014-06-26 — End: 2014-06-26

## 2014-06-26 MED ORDER — LIDOCAINE (PF) 20 MG/ML (2 %) IV SYRINGE
100 mg/5 mL (2 %) | INTRAVENOUS | Status: AC
Start: 2014-06-26 — End: ?

## 2014-06-26 MED ORDER — EPHEDRINE SULFATE 50 MG/ML IJ SOLN
50 mg/mL | INTRAMUSCULAR | Status: DC | PRN
Start: 2014-06-26 — End: 2014-06-26
  Administered 2014-06-26 (×5): via INTRAVENOUS

## 2014-06-26 MED ORDER — ONDANSETRON (PF) 4 MG/2 ML INJECTION
4 mg/2 mL | INTRAMUSCULAR | Status: DC | PRN
Start: 2014-06-26 — End: 2014-06-26
  Administered 2014-06-26: 17:00:00 via INTRAVENOUS

## 2014-06-26 MED ORDER — SUCCINYLCHOLINE CHLORIDE 20 MG/ML INJECTION
20 mg/mL | INTRAMUSCULAR | Status: DC | PRN
Start: 2014-06-26 — End: 2014-06-26
  Administered 2014-06-26: 16:00:00 via INTRAVENOUS

## 2014-06-26 MED ORDER — FENTANYL CITRATE (PF) 50 MCG/ML IJ SOLN
50 mcg/mL | INTRAMUSCULAR | Status: DC | PRN
Start: 2014-06-26 — End: 2014-06-26
  Administered 2014-06-26 (×2): via INTRAVENOUS

## 2014-06-26 MED ORDER — MIDAZOLAM 1 MG/ML IJ SOLN
1 mg/mL | INTRAMUSCULAR | Status: AC
Start: 2014-06-26 — End: ?

## 2014-06-26 MED ORDER — ROCURONIUM 10 MG/ML IV
10 mg/mL | INTRAVENOUS | Status: AC
Start: 2014-06-26 — End: ?

## 2014-06-26 MED ORDER — PROPOFOL 10 MG/ML IV EMUL
10 mg/mL | INTRAVENOUS | Status: DC | PRN
Start: 2014-06-26 — End: 2014-06-26
  Administered 2014-06-26 (×2): via INTRAVENOUS

## 2014-06-26 MED ORDER — BUPIVACAINE (PF) 0.125%  400 ML LOCAL INFILTRATION, ELAS PUMP
Status: DC
Start: 2014-06-26 — End: 2014-06-26
  Administered 2014-06-26: 17:00:00

## 2014-06-26 MED ORDER — FENTANYL CITRATE (PF) 50 MCG/ML IJ SOLN
50 mcg/mL | INTRAMUSCULAR | Status: AC
Start: 2014-06-26 — End: ?

## 2014-06-26 MED ORDER — DEXAMETHASONE SODIUM PHOSPHATE 4 MG/ML IJ SOLN
4 mg/mL | INTRAMUSCULAR | Status: DC | PRN
Start: 2014-06-26 — End: 2014-06-26
  Administered 2014-06-26: 16:00:00 via INTRAVENOUS

## 2014-06-26 MED ORDER — LIDOCAINE (PF) 20 MG/ML (2 %) IJ SOLN
20 mg/mL (2 %) | INTRAMUSCULAR | Status: DC | PRN
Start: 2014-06-26 — End: 2014-06-26
  Administered 2014-06-26: 16:00:00 via INTRAVENOUS

## 2014-06-26 MED ORDER — LACTATED RINGERS IV
INTRAVENOUS | Status: DC
Start: 2014-06-26 — End: 2014-06-26
  Administered 2014-06-26 (×2): via INTRAVENOUS

## 2014-06-26 MED ORDER — EPHEDRINE SULFATE 50 MG/ML IJ SOLN
50 mg/mL | INTRAMUSCULAR | Status: AC
Start: 2014-06-26 — End: ?

## 2014-06-26 MED ORDER — ONDANSETRON (PF) 4 MG/2 ML INJECTION
4 mg/2 mL | INTRAMUSCULAR | Status: AC
Start: 2014-06-26 — End: ?

## 2014-06-26 MED ORDER — CEFAZOLIN 2 GRAM/50 ML NS IVPB
Freq: Once | INTRAVENOUS | Status: AC
Start: 2014-06-26 — End: 2014-06-26
  Administered 2014-06-26: 16:00:00 via INTRAVENOUS

## 2014-06-26 MED ORDER — CEFAZOLIN 1 GRAM SOLUTION FOR INJECTION
1 gram | Freq: Once | INTRAMUSCULAR | Status: DC
Start: 2014-06-26 — End: 2014-06-26

## 2014-06-26 MED ORDER — SUCCINYLCHOLINE CHLORIDE 100 MG/5 ML (20 MG/ML) IV SYRINGE
100 mg/5 mL (20 mg/mL) | INTRAVENOUS | Status: AC
Start: 2014-06-26 — End: ?

## 2014-06-26 MED ORDER — EPINEPHRINE 1 MG/ML IJ SOLN
1 mg/mL | INTRAMUSCULAR | Status: AC
Start: 2014-06-26 — End: ?

## 2014-06-26 MED FILL — BD POSIFLUSH NORMAL SALINE 0.9 % INJECTION SYRINGE: INTRAMUSCULAR | Qty: 10

## 2014-06-26 MED FILL — ONDANSETRON (PF) 4 MG/2 ML INJECTION: 4 mg/2 mL | INTRAMUSCULAR | Qty: 4

## 2014-06-26 MED FILL — EPHEDRINE SULFATE 50 MG/ML IJ SOLN: 50 mg/mL | INTRAMUSCULAR | Qty: 35

## 2014-06-26 MED FILL — FENTANYL CITRATE (PF) 50 MCG/ML IJ SOLN: 50 mcg/mL | INTRAMUSCULAR | Qty: 2

## 2014-06-26 MED FILL — BACITRACIN 50,000 UNIT IM: 50000 unit | INTRAMUSCULAR | Qty: 50000

## 2014-06-26 MED FILL — XYLOCAINE-MPF 20 MG/ML (2 %) INJECTION SOLUTION: 20 mg/mL (2 %) | INTRAMUSCULAR | Qty: 40

## 2014-06-26 MED FILL — LACTATED RINGERS IV: INTRAVENOUS | Qty: 1000

## 2014-06-26 MED FILL — SUCCINYLCHOLINE CHLORIDE 100 MG/5 ML (20 MG/ML) IV SYRINGE: 100 mg/5 mL (20 mg/mL) | INTRAVENOUS | Qty: 5

## 2014-06-26 MED FILL — BUPIVACAINE (PF) 0.125%  400 ML LOCAL INFILTRATION, ELAS PUMP: Qty: 400

## 2014-06-26 MED FILL — ONDANSETRON (PF) 4 MG/2 ML INJECTION: 4 mg/2 mL | INTRAMUSCULAR | Qty: 2

## 2014-06-26 MED FILL — DEXAMETHASONE SODIUM PHOSPHATE 4 MG/ML IJ SOLN: 4 mg/mL | INTRAMUSCULAR | Qty: 8

## 2014-06-26 MED FILL — CEFAZOLIN 2 GRAM/50 ML NS IVPB: INTRAVENOUS | Qty: 50

## 2014-06-26 MED FILL — BUPIVACAINE-EPINEPHRINE (PF) 0.5 %-1:200,000 IJ SOLN: 0.5 %-1:200,000 | INTRAMUSCULAR | Qty: 30

## 2014-06-26 MED FILL — DIPRIVAN 10 MG/ML INTRAVENOUS EMULSION: 10 mg/mL | INTRAVENOUS | Qty: 20

## 2014-06-26 MED FILL — MIDAZOLAM 1 MG/ML IJ SOLN: 1 mg/mL | INTRAMUSCULAR | Qty: 5

## 2014-06-26 MED FILL — LIDOCAINE (PF) 20 MG/ML (2 %) IV SYRINGE: 100 mg/5 mL (2 %) | INTRAVENOUS | Qty: 5

## 2014-06-26 MED FILL — QUELICIN 20 MG/ML INJECTION SOLUTION: 20 mg/mL | INTRAMUSCULAR | Qty: 80

## 2014-06-26 MED FILL — DEXAMETHASONE SODIUM PHOSPHATE 4 MG/ML IJ SOLN: 4 mg/mL | INTRAMUSCULAR | Qty: 2

## 2014-06-26 MED FILL — EPHEDRINE SULFATE 50 MG/ML IJ SOLN: 50 mg/mL | INTRAMUSCULAR | Qty: 1

## 2014-06-26 MED FILL — ROCURONIUM 10 MG/ML IV: 10 mg/mL | INTRAVENOUS | Qty: 5

## 2014-06-26 MED FILL — NAROPIN (PF) 5 MG/ML (0.5 %) INJECTION SOLUTION: 5 mg/mL (0. %) | INTRAMUSCULAR | Qty: 30

## 2014-06-26 MED FILL — EPINEPHRINE 1 MG/ML IJ SOLN: 1 mg/mL | INTRAMUSCULAR | Qty: 30

## 2014-06-26 MED FILL — DIPRIVAN 10 MG/ML INTRAVENOUS EMULSION: 10 mg/mL | INTRAVENOUS | Qty: 280

## 2014-06-26 NOTE — Op Note (Signed)
ARTHROSCOPIC ROTATOR CUFF REPAIR OP NOTE       Date of Procedure: 06/26/2014   Preoperative Diagnosis: ROTATOR CUFF TEAR, IMPINGEMENT  Postoperative Diagnosis: ROTATOR CUFF TEAR, IMPINGEMENT    Procedure: Procedure(s):  RIGHT ARTHROSCOPIC ROTATOR CUFF REPAIR, SUBACROMIAL DECOMPRESSION  Surgeon: Waynette ButteryPaul G Rmani Kapusta, MD, MD  Assistant(s): Renita PapaAmanda Russell, PA-C, ATC  Anesthesia: General   Estimated Blood Loss: <50cc  Specimens: * No specimens in log *   Complications: None  Implants:  Implant Name Type Inv. Item Serial No. Manufacturer Lot No. LRB No. Used Action   ANCHOR SUT JUGGERKNOT 2.9MM --  - [id number]  ANCHOR SUT JUGGERKNOT 2.9MM --  [id number] [employer] [id number] Right 1 Implanted   ANCHOR SUT JUGGERKNOT 2.9MM --  - [id number]  ANCHOR SUT JUGGERKNOT 2.9MM --  [id number] [facility] [id number] Right 1 Implanted   ANCHOR SUT JUGGERKNOT 2.9MM --  - [id number]   ANCHOR SUT JUGGERKNOT 2.9MM --  [id number] [employer] [id number] Right 1 Implanted         INDICATIONS:  Kristina Mills  is a 48 y.o., female  who has complained of a long history of shoulder pain. After failure of conservative treatment the patient presents for definitive operative care.    DESCRIPTION OF PROCEDURE:  After being informed of the risks and benefits, which include, but are not limited to, bleeding, infection, neurovascular damage, wound complications, pain and stiffness in the shoulder, the patient consented for the procedure.  After adequate general anesthesia, the involved shoulder was prepped and draped in a sterile fashion. A standard posterior arthroscopic portal was established and the shoulder was serially inspected.  The subscapularis revealed Degenerative fraying of the intraarticular portion The labrum revealed Degenerative fraying of the superior labrum. The humeral head articular surface revealed Grade 3 changes. The glenoid articular surface revealed Grade 3 changes. The superior rotator cuff revealed a tear involving the  supraspinatus only  The posterior rotator cuff revealed Degenerative fraying involving less than 50% of the tendon thickness. All intraarticular abnormalities were debrided appropriately and articular cartilage lesions were appropriately stabilized if present.     The scope was then placed in the subacromial space where a lateral portal was established and a bursectomy was performed. The tear was debrided and the tuberosity was abraded to a bleeding bed of bone.  A superior stab incision was then made and a corkscrew anchor was inserted at the articular margin.   Using a Nevaiser portal, the sutures were passed through the medial aspect of the cuff in a mattress fashion.  These sutures were then shuttled out of the anterior portal.  A second anchor was placed anterolaterally, the sutures were passed in a simple fashion and then tied down using a modified Westin knot.  Subsequent anchors were placed laterally and the sutures were passed and tied in a similar fashion until a water tight lateral repair was completed.  The medial sutures were then tied down finalizing the repair.      The patient had an impingement lesion and the scope was then placed laterally and a bur posteriorly and a subacromial decompression was performed using the cutting block technique. 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  A final bursectomy was performed and the scope was removed. The wounds were closed. Sterile dressings were applied and the patient was awakened and taken to the recovery room in  stable condition.

## 2014-06-26 NOTE — Anesthesia Procedure Notes (Signed)
Peripheral Block    Start time: 06/26/2014 9:40 AM  End time: 06/26/2014 9:51 AM  Block Type: right interscalene  Reason for block: at surgeon's request and post-op pain management  Staffing  Anesthesiologist: Baron SaneMCLAUGHLIN, JOSEPH D  Resident/CRNA: Tilda FrancoBOKINSKY, CAROL H  Performed by: anesthesiologist   Prep  Risks and benefits discussed with the patient and plans are to proceed  Site marked, Timeout performed, 09:40  Monitoring: continuous pulse ox, frequent vital sign checks, heart rate, responsive to questions and oxygen  Injection Technique: continuous  Procedures: ultrasound guided  Patient was placed in supine position  Prep Solution(s): chlorhexidine  Region: interscalene  Needle  Needle: 18G Tuohy  Needle localization: nerve stimulator and ultrasound guidance  Minimal motor response <0.5 mA and >0.3 mA  Medication Injected: 30mL 0.5% ropivacaine    Assessment  Injection Assessment: incremental injection every 5 mL, local visualized surrounding nerve on ultrasound, negative aspiration for blood and no intravascular symptoms  Patient tolerated without any apparent complications  06/26/2014  9:57 AM  I have reviewed and agree with all aforementioned block procedure note documentation.   Rockwell AlexandriaJOSEPH D MCLAUGHLIN, MD

## 2014-06-26 NOTE — Anesthesia Pre-Procedure Evaluation (Addendum)
Anesthetic History     PONV          Review of Systems / Medical History  Patient summary reviewed, nursing notes reviewed and pertinent labs reviewed    Pulmonary  Within defined limits                 Neuro/Psych         Psychiatric history (anxiety)     Cardiovascular    Hypertension              Exercise tolerance: >4 METS     GI/Hepatic/Renal     GERD           Endo/Other      Hypothyroidism       Other Findings   Comments: ADHD         Physical Exam    Airway  Mallampati: II  TM Distance: 4 - 6 cm  Neck ROM: normal range of motion   Mouth opening: Normal     Cardiovascular    Rhythm: regular  Rate: normal         Dental    Dentition: Lower dentition intact and Upper dentition intact     Pulmonary  Breath sounds clear to auscultation               Abdominal  GI exam deferred       Other Findings            Anesthetic Plan    ASA: 3  Anesthesia type: general      Post-op pain plan if not by surgeon: peripheral nerve block continuous    Induction: Intravenous  Anesthetic plan and risks discussed with: Patient

## 2014-06-26 NOTE — Other (Signed)
D/c instructions completed with patient, son and daughter. Reviewed care of On Q catheter and removal, no questions at this time regarding instructions, state understanding. Ready to d/c home

## 2014-06-26 NOTE — Other (Signed)
EKG completed at this time and placed on chart.

## 2014-06-26 NOTE — Other (Signed)
Forced warm air gown on patient.  Patient controlled temperature device explained and given to patient.    Pt fall protocol  Yellow arm band on patient, yellow non skid socks on   bed in low position, all side rails up, call bell in reach  Pt  & family instructed in "call don't fall" protocol     Use your call bell,and wait for assistance, staff not family will assist you to get up &   move about  Pt & family verbalize understanding of fall precautions and "call don't fall" protocol.

## 2014-06-26 NOTE — Other (Signed)
Report from MyanmarJanna RN for lunch relief, patient dressed, family in room, awaiting discharge instructions and then will be ready to d/c home

## 2014-06-26 NOTE — Anesthesia Procedure Notes (Addendum)
Peripheral Block    Start time: 06/26/2014 9:40 AM  End time: 06/26/2014 9:51 AM  Block Type: right interscalene  Reason for block: at surgeon's request and post-op pain management  Staffing  Anesthesiologist: MCLAUGHLIN, JOSEPH D  Resident/CRNA: BOKINSKY, CAROL H  Performed by: anesthesiologist   Prep  Risks and benefits discussed with the patient and plans are to proceed  Site marked, Timeout performed, 09:40  Monitoring: continuous pulse ox, frequent vital sign checks, heart rate, responsive to questions and oxygen  Injection Technique: continuous  Procedures: ultrasound guided  Patient was placed in supine position  Prep Solution(s): chlorhexidine  Region: interscalene  Needle  Needle: 18G Tuohy  Needle localization: nerve stimulator and ultrasound guidance  Minimal motor response <0.5 mA and >0.3 mA  Medication Injected: 30mL 0.5% ropivacaine    Assessment  Injection Assessment: incremental injection every 5 mL, local visualized surrounding nerve on ultrasound, negative aspiration for blood and no intravascular symptoms  Patient tolerated without any apparent complications  06/26/2014  9:57 AM  I have reviewed and agree with all aforementioned block procedure note documentation.   JOSEPH D MCLAUGHLIN, MD

## 2014-06-26 NOTE — Brief Op Note (Signed)
ARTHROSCOPIC ROTATOR CUFF REPAIR OP NOTE       Date of Procedure: 06/26/2014   Preoperative Diagnosis: ROTATOR CUFF TEAR, IMPINGEMENT  Postoperative Diagnosis: ROTATOR CUFF TEAR, IMPINGEMENT    Procedure: Procedure(s):  RIGHT ARTHROSCOPIC ROTATOR CUFF REPAIR, SUBACROMIAL DECOMPRESSION  Surgeon: Ambreen Tufte G Kayson Tasker, MD, MD  Assistant(s): Amanda Russell, PA-C, ATC  Anesthesia: General   Estimated Blood Loss: <50cc  Specimens: * No specimens in log *   Complications: None  Implants:  Implant Name Type Inv. Item Serial No. Manufacturer Lot No. LRB No. Used Action   ANCHOR SUT JUGGERKNOT 2.9MM --  - [id number]  ANCHOR SUT JUGGERKNOT 2.9MM --  [id number] [employer] [id number] Right 1 Implanted   ANCHOR SUT JUGGERKNOT 2.9MM --  - [id number]  ANCHOR SUT JUGGERKNOT 2.9MM --  [id number] [employer] [id number] Right 1 Implanted   ANCHOR SUT JUGGERKNOT 2.9MM --  - [id number]   ANCHOR SUT JUGGERKNOT 2.9MM --  [id number] [employer] [id number] Right 1 Implanted         INDICATIONS:  Kristina Mills  is a 48 y.o., female  who has complained of a long history of shoulder pain. After failure of conservative treatment the patient presents for definitive operative care.    DESCRIPTION OF PROCEDURE:  After being informed of the risks and benefits, which include, but are not limited to, bleeding, infection, neurovascular damage, wound complications, pain and stiffness in the shoulder, the patient consented for the procedure.  After adequate general anesthesia, the involved shoulder was prepped and draped in a sterile fashion. A standard posterior arthroscopic portal was established and the shoulder was serially inspected.  The subscapularis revealed Degenerative fraying of the intraarticular portion The labrum revealed Degenerative fraying of the superior labrum. The humeral head articular surface revealed Grade 3 changes. The glenoid articular surface revealed Grade 3 changes. The superior rotator cuff revealed a tear  involving the supraspinatus only  The posterior rotator cuff revealed Degenerative fraying involving less than 50% of the tendon thickness. All intraarticular abnormalities were debrided appropriately and articular cartilage lesions were appropriately stabilized if present.     The scope was then placed in the subacromial space where a lateral portal was established and a bursectomy was performed. The tear was debrided and the tuberosity was abraded to a bleeding bed of bone.  A superior stab incision was then made and a corkscrew anchor was inserted at the articular margin.   Using a Nevaiser portal, the sutures were passed through the medial aspect of the cuff in a mattress fashion.  These sutures were then shuttled out of the anterior portal.  A second anchor was placed anterolaterally, the sutures were passed in a simple fashion and then tied down using a modified Westin knot.  Subsequent anchors were placed laterally and the sutures were passed and tied in a similar fashion until a water tight lateral repair was completed.  The medial sutures were then tied down finalizing the repair.      The patient had an impingement lesion and the scope was then placed laterally and a bur posteriorly and a subacromial decompression was performed using the cutting block technique. 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  A final bursectomy was performed and the scope was removed. The wounds were closed. Sterile dressings were applied and the patient was awakened and taken to the recovery room in   stable condition.

## 2014-06-26 NOTE — Anesthesia Post-Procedure Evaluation (Signed)
Post-Anesthesia Evaluation and Assessment    Patient: Kristina GeraldsRita Vig MRN: [id number]  SSN: [id number]    Date of Birth: February 21, 1966  Age: 48 y.o.  Sex: female       Cardiovascular Function/Vital Signs  Visit Vitals   Item Reading   ??? BP 130/75 mmHg   ??? Pulse 70   ??? Temp 36.4 ??C (97.5 ??F)   ??? Resp 15   ??? Ht 5\' 1"  (1.549 m)   ??? Wt 78.472 kg (173 lb)   ??? BMI 32.70 kg/m2   ??? SpO2 98%       Patient is status post general anesthesia for Procedure(s):  RIGHT ARTHROSCOPIC ROTATOR CUFF REPAIR, SUBACROMIAL DECOMPRESSION.    Nausea/Vomiting: None    Postoperative hydration reviewed and adequate.    Pain:  Pain Scale 1: Numeric (0 - 10) (06/26/14 1215)  Pain Intensity 1: 0 (06/26/14 1215)   Managed    Neurological Status:   Neuro (WDL): Within Defined Limits (06/26/14 1215)  Neuro  LUE Motor Response: Purposeful (06/26/14 1215)  LLE Motor Response: Purposeful (06/26/14 1215)  RUE Motor Response: Weak;Numbness (nerve block) (06/26/14 1215)  RLE Motor Response: Purposeful (06/26/14 1215)   At baseline    Mental Status and Level of Consciousness: Alert and oriented     Pulmonary Status:   O2 Device: Nasal cannula (06/26/14 1215)   Adequate oxygenation and airway patent    Complications related to anesthesia: None    Post-anesthesia assessment completed. No concerns    Signed By: Rockwell AlexandriaJOSEPH D Dakin Madani, MD     June 26, 2014

## 2014-06-26 NOTE — H&P (Signed)
Orthopaedic PRE-OP Admission History and Physical    Patient: Kristina Mills MRN: [id number]  SSN: [id number]    Date of Birth: 05-21-1966  Age: 48 y.o.  Sex: female            Subjective:   Patient is a 48 y.o. Hispanic female who presents with history of right rotator cuff tear.  The patient was evaluated and determined the most appropriate plan of care is to proceed with surgical intervention.Patient denies chest pain, tightness, pain radiating down left arm, palpitations, dizziness, lightheadedness, fevers, chills. Patient denies shortness of breath, wheezing, diarrhea, constipation, abdominal pain. Patient denies urinary problems, dysuria, hesitancy, urgency. Denies skin breakdown, rashes, insect bites or open area. PCP is Young.  Pt. Is a nonsmoker.        There are no active problems to display for this patient.    Past Medical History   Diagnosis Date   ??? ADHD (attention deficit hyperactivity disorder)    ??? Hypertension    ??? Nausea & vomiting    ??? GERD (gastroesophageal reflux disease)    ??? Psychiatric disorder       Past Surgical History   Procedure Laterality Date   ??? Hx tubal ligation  1991      Prior to Admission medications    Medication Sig Start Date End Date Taking? Authorizing Provider   HYDROcodone-acetaminophen [facility](NORCO) 5-325 mg per tablet 1-2 tabs PO Q 4-6 hrs PRN 06/16/12  Yes Johnney Ouebecca Ellen, PA   famotidine (PEPCID) 20 mg tablet Take 20 mg by mouth as needed.   Yes Historical Provider   lisinopril (PRINIVIL, ZESTRIL) 10 mg tablet  11/08/11  Yes Historical Provider   citalopram (CELEXA) 40 mg tablet  12/26/11  Yes Historical Provider   AMPHETAMINE SALT COMBO 5 mg tablet  12/09/11  Yes Historical Provider     Current Facility-Administered Medications   Medication Dose Route Frequency   ??? lidocaine (PF) (XYLOCAINE) 10 mg/mL (1 %) injection 0.1 mL  0.1 mL SubCUTAneous PRN   ??? lactated ringers infusion  100 mL/hr IntraVENous CONTINUOUS    ??? sodium chloride (NS) flush 5-10 mL  5-10 mL IntraVENous Q8H   ??? sodium chloride (NS) flush 5-10 mL  5-10 mL IntraVENous PRN   ??? sodium chloride (NS) flush 5-10 mL  5-10 mL IntraVENous Q8H   ??? sodium chloride (NS) flush 5-10 mL  5-10 mL IntraVENous PRN   ??? lidocaine (PF) (XYLOCAINE) 10 mg/mL (1 %) injection 0.1 mL  0.1 mL SubCUTAneous PRN   ??? lactated ringers infusion  125 mL/hr IntraVENous CONTINUOUS   ??? sodium chloride (NS) flush 5-10 mL  5-10 mL IntraVENous Q8H   ??? sodium chloride (NS) flush 5-10 mL  5-10 mL IntraVENous PRN   ??? lidocaine (PF) (XYLOCAINE) 10 mg/mL (1 %) injection 0.1 mL  0.1 mL SubCUTAneous PRN   ??? lactated ringers infusion  125 mL/hr IntraVENous CONTINUOUS   ??? sodium chloride (NS) flush 5-10 mL  5-10 mL IntraVENous Q8H   ??? sodium chloride (NS) flush 5-10 mL  5-10 mL IntraVENous PRN   ??? lidocaine (PF) (XYLOCAINE) 10 mg/mL (1 %) injection 0.1 mL  0.1 mL SubCUTAneous PRN   ??? ceFAZolin in 0.9% NS (ANCEF) IVPB soln 2 g  2 g IntraVENous ONCE      No Known Allergies   History   Substance Use Topics   ??? Smoking status: Never Smoker    ??? Smokeless tobacco: Never Used   ??? Alcohol Use: 0.5 oz/week  1 Glasses of wine per week      Comment: during holiday      Family History   Problem Relation Age of Onset   ??? Hypertension Mother         Review of Systems  A comprehensive review of systems was negative except for that written in the HPI.        Objective:   Patient Vitals for the past 8 hrs:   BP Temp Pulse Resp SpO2 Height Weight   06/26/14 0821 140/70 mmHg 98.7 ??F (37.1 ??C) 69 16 98 % - -   06/26/14 0809 - - - - - 5\' 1"  (1.549 m) 78.472 kg (173 lb)     Temp (24hrs), Avg:98.7 ??F (37.1 ??C), Min:98.7 ??F (37.1 ??C), Max:98.7 ??F (37.1 ??C)      Gen: Well-developed,  in no acute distress   HEENT: Pink conjunctivae, PERRL, hearing intact to voice, moist mucous membranes   Neck: Supple, without masses, thyroid non-tender   Resp: No accessory muscle use, clear breath sounds without wheezes rales or rhonchi    Card: No murmurs, normal S1, S2 without thrills, bruits or peripheral edema   Abd: Soft, non-tender, non-distended, normoactive bowel sounds are present, no palpable organomegaly and no detectable hernias   Lymph: No cervical or inguinal adenopathy   Musc: see office notes   Skin: No skin breakdown noted. Skin warm, pink, dry. No rashes.  Neuro: Cranial nerves are grossly intact, no focal motor weakness, follows commands appropriately   Psych: Good insight, oriented to person, place and time, alert      Labs:   Recent Results (from the past 24 hour(s))   METABOLIC PANEL, BASIC    Collection Time: 06/26/14  8:30 AM   Result Value Ref Range    Sodium 142 136 - 145 mmol/L    Potassium 3.9 3.5 - 5.1 mmol/L    Chloride 106 97 - 108 mmol/L    CO2 25 21 - 32 mmol/L    Anion gap 11 5 - 15 mmol/L    Glucose 116 (H) 65 - 100 mg/dL    BUN 12 6 - 20 MG/DL    Creatinine 0.450.79 4.090.55 - 1.02 MG/DL    BUN/Creatinine ratio 15 12 - 20      GFR est AA >60 >60 ml/min/1.2573m2    GFR est non-AA >60 >60 ml/min/1.2373m2    Calcium 8.5 8.5 - 10.1 MG/DL       Assessment:   There are no active problems to display for this patient.    Right rotator cuff tear    Plan:   Proceed with surgical intervention without contraindications.   this morning and discussed increased ambulation to improve pulmonary status. We also discussed preop and postop expectations to include pain management. All questions were answered.        Waynette ButteryPaul G Wylene Weissman, MD, MD

## 2014-06-26 NOTE — Other (Signed)
Post-Block/Spinal Note    Post Pain Block / Spinal report received from  A. GUnn,  Anesthesia RN.    Patient is status-post    Interscalene  Block  With   regional nerve catheter.     Regional nerve catheter (if applicable) dressed and capped - note LDA.    Patient on assessment is Arouseable to voice.     Airway is patent on   Supplemental O2 at  3LPM.    Respiratory pattern is:    Even, Non-labored.      Sensory below block  Diminished.   Motor below block  Weak      Patient on monitor; note vital signs recorded in doc flow-sheet.  Bed in low position, side rails up, call bell in reach.

## 2014-06-26 NOTE — Op Note (Signed)
ARTHROSCOPIC ROTATOR CUFF REPAIR OP NOTE       Date of Procedure: 06/26/2014   Preoperative Diagnosis: ROTATOR CUFF TEAR, IMPINGEMENT  Postoperative Diagnosis: ROTATOR CUFF TEAR, IMPINGEMENT    Procedure: Procedure(s):  RIGHT ARTHROSCOPIC ROTATOR CUFF REPAIR, SUBACROMIAL DECOMPRESSION  Surgeon: Waynette ButteryPaul G Kameran Mcneese, MD, MD  Assistant(s): Renita PapaAmanda Russell, PA-C, ATC  Anesthesia: General   Estimated Blood Loss: <50cc  Specimens: * No specimens in log *   Complications: None  Implants:  Implant Name Type Inv. Item Serial No. Manufacturer Lot No. LRB No. Used Action   ANCHOR SUT JUGGERKNOT 2.9MM --  - [id number]  ANCHOR SUT JUGGERKNOT 2.9MM --  [id number] [employer] [id number] Right 1 Implanted   ANCHOR SUT JUGGERKNOT 2.9MM --  - [id number]  ANCHOR SUT JUGGERKNOT 2.9MM --  [id number] [facility] [id number] Right 1 Implanted   ANCHOR SUT JUGGERKNOT 2.9MM --  - [id number]   ANCHOR SUT JUGGERKNOT 2.9MM --  [id number] [employer] [id number] Right 1 Implanted         INDICATIONS:  Kristina GeraldsRita Mills  is a 48 y.o., female  who has complained of a long history of shoulder pain. After failure of conservative treatment the patient presents for definitive operative care.    DESCRIPTION OF PROCEDURE:  After being informed of the risks and benefits, which include, but are not limited to, bleeding, infection, neurovascular damage, wound complications, pain and stiffness in the shoulder, the patient consented for the procedure.  After adequate general anesthesia, the involved shoulder was prepped and draped in a sterile fashion. A standard posterior arthroscopic portal was established and the shoulder was serially inspected.  The subscapularis revealed Degenerative fraying of the intraarticular portion The labrum revealed Degenerative fraying of the superior labrum. The humeral head articular surface revealed Grade 3 changes. The glenoid articular surface revealed Grade 3 changes. The superior rotator cuff revealed a tear  involving the supraspinatus only  The posterior rotator cuff revealed Degenerative fraying involving less than 50% of the tendon thickness. All intraarticular abnormalities were debrided appropriately and articular cartilage lesions were appropriately stabilized if present.     The scope was then placed in the subacromial space where a lateral portal was established and a bursectomy was performed. The tear was debrided and the tuberosity was abraded to a bleeding bed of bone.  A superior stab incision was then made and a corkscrew anchor was inserted at the articular margin.   Using a Nevaiser portal, the sutures were passed through the medial aspect of the cuff in a mattress fashion.  These sutures were then shuttled out of the anterior portal.  A second anchor was placed anterolaterally, the sutures were passed in a simple fashion and then tied down using a modified Westin knot.  Subsequent anchors were placed laterally and the sutures were passed and tied in a similar fashion until a water tight lateral repair was completed.  The medial sutures were then tied down finalizing the repair.      The patient had an impingement lesion and the scope was then placed laterally and a bur posteriorly and a subacromial decompression was performed using the cutting block technique. 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  A final bursectomy was performed and the scope was removed. The wounds were closed. Sterile dressings were applied and the patient was awakened and taken to the recovery room in  stable condition.

## 2014-07-07 DIAGNOSIS — I219 Acute myocardial infarction, unspecified: Secondary | ICD-10-CM

## 2014-07-07 HISTORY — DX: Acute myocardial infarction, unspecified: I21.9

## 2014-07-26 NOTE — Progress Notes (Signed)
[facility]  [address], [address]  [place], [place] [id number]  Phone: [phone]  Fax: [phone]    Discharge Summary  2-15    Patient name: Kristina GeraldsRita Mills  DOB: Jan 05, 1966  Provider#: [id number]  Referral source: Donnelly StagerWesdock, James, MD      Medical/Treatment Diagnosis: Right shoulder pain [M25.511]     Prior Hospitalization: see medical history     Comorbidities: See Plan of Care  Prior Level of Function:See Plan of Care  Medications: Verified on Patient Summary List    Start of Care: 6      Onset Date: 03/30/14  Visits from Start of Care: 6     Missed Visits: 1  Reporting Period : 05/01/14 to 05/15/14      ASSESSMENT/SUMMARY OF CARE: Ms Kristina LindauRhodes was seen for 6 visits with one missed visit.  Treatment consisted of manual therapy, therapeutic exercise, modalities, ergonomic recommendations/patient education, home exercise program prescription. She made some gains with therapy, demonstrated good effort in the clinic and with her home exercise program.  She self-discharged with status unknown.      Short Term Goals: To be accomplished in 4?? weeks:  1) Patient will demonstrate good understanding of HEP  2) Patient will demonstrate good understanding of sitting/standing/work postures to improve GH alignment/function  3) Patient will report no R shoulder pain at rest, sleep without waking due to pain.   4) Patient will have full passive R shoulder ROM without pain.    Long Term Goals: To be accomplished in 8 weeks:  1) Patient will demonstrate full active R shoulder ROM without pain in order to perform work reaching activities.  2) Patient will demonstrate good R RTC strength at 4+/5 in order to perform ADL's/work activities.  3) Patient will report no pain with reaching, scrubbing, dressing activities.   4) Patient will discharge to progressive?? HEP.     RECOMMENDATIONS:  Discontinue therapy: Patient has reached or is progressing toward set goals       Patient is non-compliant or has abdicated      Due to lack of appreciable progress towards set goals      Other:  Patient has self-discharged after 6 visits reporting some improvements.    Leotis Painobyn L Montae Stager, PT, MSPT   07/26/2014 3:41 PM

## 2015-01-08 ENCOUNTER — Emergency Department (HOSPITAL_COMMUNITY): Payer: 59

## 2015-01-08 ENCOUNTER — Inpatient Hospital Stay (HOSPITAL_COMMUNITY)
Admission: EM | Admit: 2015-01-08 | Discharge: 2015-01-13 | DRG: 280 | Disposition: A | Discharge status: Home / Self Care | Payer: 59 | Attending: Cardiovascular Disease | Admitting: Cardiovascular Disease

## 2015-01-08 ENCOUNTER — Encounter (HOSPITAL_COMMUNITY): Payer: Self-pay | Admitting: Emergency Medicine

## 2015-01-08 DIAGNOSIS — I214 Non-ST elevation (NSTEMI) myocardial infarction: Principal | ICD-10-CM | POA: Diagnosis present

## 2015-01-08 DIAGNOSIS — I255 Ischemic cardiomyopathy: Secondary | ICD-10-CM | POA: Diagnosis present

## 2015-01-08 DIAGNOSIS — K219 Gastro-esophageal reflux disease without esophagitis: Secondary | ICD-10-CM | POA: Diagnosis present

## 2015-01-08 DIAGNOSIS — R079 Chest pain, unspecified: Secondary | ICD-10-CM

## 2015-01-08 DIAGNOSIS — I2542 Coronary artery dissection: Secondary | ICD-10-CM | POA: Diagnosis present

## 2015-01-08 DIAGNOSIS — I493 Ventricular premature depolarization: Secondary | ICD-10-CM | POA: Diagnosis present

## 2015-01-08 DIAGNOSIS — I2510 Atherosclerotic heart disease of native coronary artery without angina pectoris: Secondary | ICD-10-CM | POA: Diagnosis present

## 2015-01-08 DIAGNOSIS — I7300 Raynaud's syndrome without gangrene: Secondary | ICD-10-CM | POA: Diagnosis present

## 2015-01-08 DIAGNOSIS — I10 Essential (primary) hypertension: Secondary | ICD-10-CM | POA: Diagnosis present

## 2015-01-08 HISTORY — DX: Gastro-esophageal reflux disease without esophagitis: K21.9

## 2015-01-08 HISTORY — DX: Essential (primary) hypertension: I10

## 2015-01-08 LAB — CBC
HEMATOCRIT: 37.5 % (ref 36.0–46.0)
Hemoglobin: 12.5 g/dL (ref 12.0–15.0)
MCH: 29.6 pg (ref 26.0–34.0)
MCHC: 33.3 g/dL (ref 30.0–36.0)
MCV: 88.7 fL (ref 78.0–100.0)
PLATELETS: 345 10*3/uL (ref 150–400)
RBC: 4.23 MIL/uL (ref 3.87–5.11)
RDW: 13.5 % (ref 11.5–15.5)
WBC: 11.4 10*3/uL — AB (ref 4.0–10.5)

## 2015-01-08 LAB — BASIC METABOLIC PANEL
Anion gap: 10 (ref 5–15)
BUN: 11 mg/dL (ref 6–20)
CO2: 22 mmol/L (ref 22–32)
Calcium: 8.7 mg/dL — ABNORMAL LOW (ref 8.9–10.3)
Chloride: 106 mmol/L (ref 101–111)
Creatinine, Ser: 0.68 mg/dL (ref 0.44–1.00)
GFR calc Af Amer: >60 mL/min (ref >60)
GLUCOSE: 163 mg/dL — AB (ref 65–99)
POTASSIUM: 3.4 mmol/L — AB (ref 3.5–5.1)
SODIUM: 138 mmol/L (ref 135–145)

## 2015-01-08 LAB — I-STAT TROPONIN, ED: TROPONIN I, POC: 1.45 ng/mL — AB (ref 0.00–0.08)

## 2015-01-08 MED ORDER — CLOPIDOGREL BISULFATE 300 MG PO TABS
600.0000 mg | ORAL_TABLET | Freq: Once | ORAL | Status: AC
  Administered 2015-01-08: 600 mg via ORAL
  Filled 2015-01-08: qty 2

## 2015-01-08 MED ORDER — NITROGLYCERIN 0.4 MG SL SUBL
0.4000 mg | SUBLINGUAL_TABLET | SUBLINGUAL | Status: DC | PRN
  Administered 2015-01-08 (×2): 0.4 mg via SUBLINGUAL
  Filled 2015-01-08: qty 1

## 2015-01-08 MED ORDER — LABETALOL HCL 5 MG/ML IV SOLN
10.0000 mg | Freq: Once | INTRAVENOUS | Status: DC
  Filled 2015-01-08: qty 4

## 2015-01-08 MED ORDER — HEPARIN BOLUS VIA INFUSION
3000.0000 [IU] | Freq: Once | INTRAVENOUS | Status: AC
  Administered 2015-01-08: 3000 [IU] via INTRAVENOUS
  Filled 2015-01-08: qty 3000

## 2015-01-08 MED ORDER — METOPROLOL TARTRATE 25 MG PO TABS
12.5000 mg | ORAL_TABLET | Freq: Once | ORAL | Status: AC
  Administered 2015-01-08: 12.5 mg via ORAL
  Filled 2015-01-08: qty 1

## 2015-01-08 MED ORDER — HEPARIN (PORCINE) IN NACL 100-0.45 UNIT/ML-% IJ SOLN
1100.0000 [IU]/h | INTRAMUSCULAR | Status: DC
  Administered 2015-01-08: 900 [IU]/h via INTRAVENOUS
  Filled 2015-01-08: qty 250

## 2015-01-08 NOTE — Progress Notes (Signed)
ANTICOAGULATION CONSULT NOTE - Initial Consult  Pharmacy Consult for heparin Indication: chest pain/ACS   Patient Measurements: Height: 5\' 1"  (154.9 cm) Weight: 170 lb (77.111 kg) IBW/kg (Calculated) : 47.8 Heparin Dosing Weight: 65kg  Vital Signs: Temp: 98.4 F (36.9 C) (07/04 2128) Temp Source: Oral (07/04 2128) BP: 154/85 mmHg (07/04 2252) Pulse Rate: 74 (07/04 2252)  Labs:  Recent Labs  01/08/15 2215  HGB 12.5  HCT 37.5  PLT 345  CREATININE 0.68    Estimated Creatinine Clearance: 80.8 mL/min (by C-G formula based on Cr of 0.68).    Assessment: 49yo female c/o acute onset of central CP radiating to LUE associated w/ N/Vx1 prior to EMS arrival, CP relieved w/ EMS but returned in ED, initial istat troponin elevated at 1.45, to begin heparin.  Goal of Therapy:  Heparin level 0.3-0.7 units/ml Monitor platelets by anticoagulation protocol: Yes   Plan:  Will give heparin 3000 units IV bolus x1 followed by gtt at 900 units/hr and monitor heparin levels and CBC.  Wynona Neat, PharmD, BCPS  01/08/2015,11:17 PM

## 2015-01-08 NOTE — ED Notes (Signed)
Dr. Susy Manor paged to [phone] @[phone] .

## 2015-01-08 NOTE — ED Provider Notes (Signed)
CSN: [id number]     Arrival date & time 01/08/15  2111 History   First MD Initiated Contact with Patient 01/08/15 2218     Chief Complaint  Patient presents with  . Chest Pain     (Consider location/radiation/quality/duration/timing/severity/associated sxs/prior Treatment) Patient is a 49 y.o. female presenting with chest pain. The history is provided by the patient.  Chest Pain Pain location:  Substernal area Pain quality: pressure   Pain radiates to:  Upper back and L arm Pain radiates to the back: yes   Pain severity:  Moderate Onset quality:  Sudden Timing:  Intermittent Progression:  Waxing and waning Chronicity:  New Context: at rest   Relieved by:  Nothing Worsened by:  Nothing tried Ineffective treatments:  None tried Associated symptoms: back pain, diaphoresis, nausea and shortness of breath   Associated symptoms: no abdominal pain, no altered mental status, no anxiety, no claudication, no cough, no dizziness, no fatigue, no fever, no headache, no numbness, no palpitations, not vomiting and no weakness   Risk factors: hypertension   Risk factors: no coronary artery disease, no diabetes mellitus, not female and no smoking     Past Medical History  Diagnosis Date  . Hypertension   . GERD (gastroesophageal reflux disease)    History reviewed. No pertinent past surgical history. History reviewed. No pertinent family history. History  Substance Use Topics  . Smoking status: Never Smoker   . Smokeless tobacco: Not on file  . Alcohol Use: No   OB History    No data available     Review of Systems  Constitutional: Positive for diaphoresis. Negative for fever and fatigue.  Eyes: Negative for visual disturbance.  Respiratory: Positive for shortness of breath. Negative for cough and chest tightness.   Cardiovascular: Positive for chest pain. Negative for palpitations and claudication.  Gastrointestinal: Positive for nausea. Negative for vomiting, abdominal pain and  constipation.  Musculoskeletal: Positive for back pain.  Neurological: Negative for dizziness, weakness, light-headedness, numbness and headaches.  Psychiatric/Behavioral: Negative for confusion.  All other systems reviewed and are negative.     Allergies  Review of patient's allergies indicates no known allergies.  Home Medications   Prior to Admission medications   Medication Sig Start Date End Date Taking? Authorizing Provider  amphetamine-dextroamphetamine (ADDERALL) 5 MG tablet Take 1 tablet by mouth 2 (two) times daily with a meal.  10/30/14   Historical Provider, MD  citalopram (CELEXA) 40 MG tablet Take 40 mg by mouth daily. 10/30/14   Historical Provider, MD  famotidine (PEPCID) 20 MG tablet Take 20 mg by mouth daily as needed for heartburn or indigestion.  10/30/14   Historical Provider, MD  levothyroxine (SYNTHROID, LEVOTHROID) 50 MCG tablet Take 50 mcg by mouth daily. 10/30/14   Historical Provider, MD  lisinopril (PRINIVIL,ZESTRIL) 10 MG tablet Take 10 mg by mouth daily. 10/30/14   Historical Provider, MD   BP 157/107 mmHg  Pulse 71  Temp(Src) 98.4 F (36.9 C) (Oral)  Resp 23  SpO2 96% Physical Exam  Constitutional: Vital signs are normal. She appears well-developed and well-nourished. She does not appear ill. No distress.  HENT:  Head: Normocephalic and atraumatic.  Nose: Nose normal.  Mouth/Throat: Oropharynx is clear and moist. No oropharyngeal exudate.  Eyes: EOM are normal. Pupils are equal, round, and reactive to light.  Neck: Normal range of motion. Neck supple.  Cardiovascular: Normal rate, regular rhythm, normal heart sounds and intact distal pulses.   No murmur heard. Pulmonary/Chest: Effort normal and  breath sounds normal. No respiratory distress. She has no wheezes. She exhibits no tenderness.  Abdominal: Soft. There is no tenderness. There is no rebound and no guarding.  Musculoskeletal: Normal range of motion. She exhibits no tenderness.   Lymphadenopathy:    She has no cervical adenopathy.  Neurological: She is alert. No cranial nerve deficit. Coordination normal.  Skin: Skin is warm and dry. She is not diaphoretic.  Psychiatric: She has a normal mood and affect. Her behavior is normal. Judgment and thought content normal.  Nursing note and vitals reviewed.   ED Course  Procedures (including critical care time) Labs Review Labs Reviewed  CBC - Abnormal; Notable for the following:    WBC 11.4 (*)    All other components within normal limits  BASIC METABOLIC PANEL - Abnormal; Notable for the following:    Potassium 3.4 (*)    Glucose, Bld 163 (*)    Calcium 8.7 (*)    All other components within normal limits  TROPONIN I - Abnormal; Notable for the following:    Troponin I 3.00 (*)    All other components within normal limits  I-STAT TROPOININ, ED - Abnormal; Notable for the following:    Troponin i, poc 1.45 (*)    All other components within normal limits  HEPARIN LEVEL (UNFRACTIONATED)  CBC  MAGNESIUM  TSH  TROPONIN I  TROPONIN I  TROPONIN I  HEMOGLOBIN U7O  BASIC METABOLIC PANEL  LIPID PANEL    Imaging Review Dg Chest Port 1 View  01/08/2015   CLINICAL DATA:  Substernal chest pain since this evening. History of hypertension.  EXAM: PORTABLE CHEST - 1 VIEW  COMPARISON:  None.  FINDINGS: The cardiac silhouette is upper limits of normal in size, mediastinal silhouette is nonsuspicious. No pleural effusions or focal consolidation. No pneumothorax. Soft tissue planes and included osseous structures are nonsuspicious.  IMPRESSION: Borderline cardiomegaly, no acute pulmonary process.   Electronically Signed   By: Elon Alas M.D.   On: 01/08/2015 23:55     EKG Interpretation   Date/Time:  Monday January 08 2015 22:42:10 EDT Ventricular Rate:  67 PR Interval:  155 QRS Duration: 93 QT Interval:  404 QTC Calculation: 426 R Axis:   -7 Text Interpretation:  Sinus rhythm Probable left ventricular  hypertrophy  new t-wave in anterior leads Confirmed by Maryan Rued  MD, Loree Fee ([id number]) on  01/08/2015 10:56:15 PM      MDM   Final diagnoses:  Chest pain   Pt is a 49 yo F with hx of HTN who presented with chest pain.  Complains of chest pain since 8 pm tonight.  Acute onset of substernal pressure that radiates to arms, upper back, and neck.  Associated nausea, diaphoresis, and SOB.   Took ASA 324 at home before she came to the ED, around 2000.  Chest pain free on arrival here.   EMS EKG concerning for possible ST elevations, but repeat in the ED was benign.  Mild repolarization abnormalities and nonspecific TWI at V1.   Her chest pain returned while in the ED waiting on labs, around 2230.   Repeat EKG after pain onset showed new TWI at V2 now --> Dynamic EKG changes Given nitroglycerine x 2 and pain resolved.    istat Trop 1.45 Elevated BP, but improved back to normotensive with NTG.    Paged cards at 2250 Given heparin and plavix load, and PO lopressor per cards.   Lab troponin sent.   Admitted to cardiology.  Plan to likely cath her tomorrow.  Patient informed and agreeable to plan.     If performed, labs, EKGs, and imaging were reviewed and interpreted by myself and my attending, and incorporated in the medical decision making.  Patient was seen with ED Attending, Dr. Dorma Russell, MD    Tori Milks, MD 01/10/15 0175  Blanchie Dessert, MD 01/11/15 1534

## 2015-01-08 NOTE — ED Notes (Signed)
Pt experiencing and increase in chest pain at this time. Pt stated a chest pressure in the center of her chest. Repeat EKG performed and given to MD. Pt's blood work came back with an elevated troponin level. Pt given one sublingual nitro.

## 2015-01-08 NOTE — ED Notes (Signed)
Pt stated that she was talking to her sister when she experienced and acute onset of generalized chest pain in the center of her chest. Pt stated the pain was stabbing pain that radiated down her left arm. Pt denied any shortness of breath, but stated she vomitted prior to EMS arrival. Enroute to the hospital, patient was placed on 2liters O2 and chest pain was relieved. Pt is pain free at this time.

## 2015-01-09 ENCOUNTER — Encounter (HOSPITAL_COMMUNITY)
Admission: EM | Disposition: A | Discharge status: Home / Self Care | Payer: 59 | Source: Home / Self Care | Attending: Cardiovascular Disease

## 2015-01-09 ENCOUNTER — Other Ambulatory Visit: Payer: Self-pay | Admitting: *Deleted

## 2015-01-09 ENCOUNTER — Inpatient Hospital Stay (HOSPITAL_COMMUNITY): Payer: 59

## 2015-01-09 ENCOUNTER — Encounter (HOSPITAL_COMMUNITY): Payer: Self-pay | Admitting: Internal Medicine

## 2015-01-09 DIAGNOSIS — R079 Chest pain, unspecified: Secondary | ICD-10-CM

## 2015-01-09 DIAGNOSIS — I2510 Atherosclerotic heart disease of native coronary artery without angina pectoris: Secondary | ICD-10-CM

## 2015-01-09 DIAGNOSIS — I10 Essential (primary) hypertension: Secondary | ICD-10-CM | POA: Diagnosis present

## 2015-01-09 DIAGNOSIS — I2542 Coronary artery dissection: Secondary | ICD-10-CM | POA: Insufficient documentation

## 2015-01-09 HISTORY — PX: CARDIAC CATHETERIZATION: SHX172

## 2015-01-09 LAB — MAGNESIUM: MAGNESIUM: 2.4 mg/dL (ref 1.7–2.4)

## 2015-01-09 LAB — CBC
HCT: 38.5 % (ref 36.0–46.0)
HEMATOCRIT: 37.9 % (ref 36.0–46.0)
HEMOGLOBIN: 12.4 g/dL (ref 12.0–15.0)
Hemoglobin: 12.8 g/dL (ref 12.0–15.0)
MCH: 29.7 pg (ref 26.0–34.0)
MCH: 29 pg (ref 26.0–34.0)
MCHC: 33.2 g/dL (ref 30.0–36.0)
MCHC: 32.7 g/dL (ref 30.0–36.0)
MCV: 89.3 fL (ref 78.0–100.0)
MCV: 88.6 fL (ref 78.0–100.0)
PLATELETS: 369 10*3/uL (ref 150–400)
Platelets: 370 10*3/uL (ref 150–400)
RBC: 4.31 MIL/uL (ref 3.87–5.11)
RBC: 4.28 MIL/uL (ref 3.87–5.11)
RDW: 13.6 % (ref 11.5–15.5)
RDW: 13.8 % (ref 11.5–15.5)
WBC: 11.9 10*3/uL — ABNORMAL HIGH (ref 4.0–10.5)
WBC: 8.8 10*3/uL (ref 4.0–10.5)

## 2015-01-09 LAB — BASIC METABOLIC PANEL
Anion gap: 8 (ref 5–15)
BUN: 8 mg/dL (ref 6–20)
CO2: 24 mmol/L (ref 22–32)
Calcium: 8.7 mg/dL — ABNORMAL LOW (ref 8.9–10.3)
Chloride: 106 mmol/L (ref 101–111)
Creatinine, Ser: 0.68 mg/dL (ref 0.44–1.00)
GFR calc non Af Amer: >60 mL/min (ref >60)
Glucose, Bld: 109 mg/dL — ABNORMAL HIGH (ref 65–99)
POTASSIUM: 3.6 mmol/L (ref 3.5–5.1)
SODIUM: 138 mmol/L (ref 135–145)

## 2015-01-09 LAB — LIPID PANEL
CHOL/HDL RATIO: 4.9 ratio
CHOLESTEROL: 210 mg/dL — AB (ref 0–200)
HDL: 43 mg/dL (ref >40)
LDL Cholesterol: 140 mg/dL — ABNORMAL HIGH (ref 0–99)
Triglycerides: 135 mg/dL (ref <150)
VLDL: 27 mg/dL (ref 0–40)

## 2015-01-09 LAB — PROTIME-INR
INR: 1.06 (ref 0.00–1.49)
Prothrombin Time: 14 seconds (ref 11.6–15.2)

## 2015-01-09 LAB — TROPONIN I
TROPONIN I: 4.53 ng/mL — AB (ref <0.031)
TROPONIN I: 3.17 ng/mL — AB (ref <0.031)
Troponin I: 3 ng/mL (ref <0.031)

## 2015-01-09 LAB — CREATININE, SERUM
Creatinine, Ser: 0.71 mg/dL (ref 0.44–1.00)
GFR calc Af Amer: >60 mL/min (ref >60)
GFR calc non Af Amer: >60 mL/min (ref >60)

## 2015-01-09 LAB — TSH: TSH: 3.54 u[IU]/mL (ref 0.350–4.500)

## 2015-01-09 LAB — PLATELET INHIBITION P2Y12: PLATELET FUNCTION P2Y12: 92 [PRU] — AB (ref 194–418)

## 2015-01-09 LAB — MRSA PCR SCREENING: MRSA by PCR: NEGATIVE

## 2015-01-09 LAB — HEPARIN LEVEL (UNFRACTIONATED): Heparin Unfractionated: 0.13 IU/mL — ABNORMAL LOW (ref 0.30–0.70)

## 2015-01-09 SURGERY — LEFT HEART CATH AND CORONARY ANGIOGRAPHY
Anesthesia: LOCAL

## 2015-01-09 MED ORDER — LIDOCAINE HCL (PF) 1 % IJ SOLN
INTRAMUSCULAR | Status: AC
  Filled 2015-01-09: qty 30

## 2015-01-09 MED ORDER — HEPARIN SODIUM (PORCINE) 1000 UNIT/ML IJ SOLN
INTRAMUSCULAR | Status: AC
  Filled 2015-01-09: qty 1

## 2015-01-09 MED ORDER — SODIUM CHLORIDE 0.9 % WEIGHT BASED INFUSION
3.0000 mL/kg/h | INTRAVENOUS | Status: DC
  Administered 2015-01-09: 3 mL/kg/h via INTRAVENOUS

## 2015-01-09 MED ORDER — SODIUM CHLORIDE 0.9 % IV SOLN: 250.0000 mL | INTRAVENOUS | Status: DC | PRN

## 2015-01-09 MED ORDER — FENTANYL CITRATE (PF) 100 MCG/2ML IJ SOLN
INTRAMUSCULAR | Status: DC | PRN
  Administered 2015-01-09: 25 ug via INTRAVENOUS

## 2015-01-09 MED ORDER — MIDAZOLAM HCL 2 MG/2ML IJ SOLN
INTRAMUSCULAR | Status: DC | PRN
  Administered 2015-01-09: 1 mg via INTRAVENOUS

## 2015-01-09 MED ORDER — FENTANYL CITRATE (PF) 100 MCG/2ML IJ SOLN
INTRAMUSCULAR | Status: AC
  Filled 2015-01-09: qty 2

## 2015-01-09 MED ORDER — SODIUM CHLORIDE 0.9 % WEIGHT BASED INFUSION: 1.0000 mL/kg/h | INTRAVENOUS | Status: DC

## 2015-01-09 MED ORDER — ATORVASTATIN CALCIUM 80 MG PO TABS
80.0000 mg | ORAL_TABLET | Freq: Every day | ORAL | Status: DC
  Administered 2015-01-09 – 2015-01-12 (×5): 80 mg via ORAL
  Filled 2015-01-09 (×6): qty 1

## 2015-01-09 MED ORDER — SODIUM CHLORIDE 0.9 % IJ SOLN
3.0000 mL | INTRAMUSCULAR | Status: DC | PRN
Start: 2015-01-09 — End: 2015-01-09

## 2015-01-09 MED ORDER — HEPARIN SODIUM (PORCINE) 5000 UNIT/ML IJ SOLN
5000.0000 [IU] | Freq: Three times a day (TID) | INTRAMUSCULAR | Status: DC
  Administered 2015-01-10 – 2015-01-12 (×7): 5000 [IU] via SUBCUTANEOUS
  Filled 2015-01-09 (×10): qty 1

## 2015-01-09 MED ORDER — SODIUM CHLORIDE 0.9 % IJ SOLN
3.0000 mL | Freq: Two times a day (BID) | INTRAMUSCULAR | Status: DC
  Administered 2015-01-10 – 2015-01-11 (×3): 3 mL via INTRAVENOUS

## 2015-01-09 MED ORDER — METOPROLOL TARTRATE 12.5 MG HALF TABLET
12.5000 mg | ORAL_TABLET | Freq: Two times a day (BID) | ORAL | Status: DC
  Administered 2015-01-09: 12.5 mg via ORAL
  Filled 2015-01-09 (×2): qty 1

## 2015-01-09 MED ORDER — NITROGLYCERIN 0.4 MG SL SUBL
0.4000 mg | SUBLINGUAL_TABLET | SUBLINGUAL | Status: DC | PRN
  Administered 2015-01-09 (×2): 0.4 mg via SUBLINGUAL
  Filled 2015-01-09 (×3): qty 1

## 2015-01-09 MED ORDER — CLOPIDOGREL BISULFATE 75 MG PO TABS
75.0000 mg | ORAL_TABLET | Freq: Every day | ORAL | Status: DC
  Administered 2015-01-09: 75 mg via ORAL
  Filled 2015-01-09: qty 1

## 2015-01-09 MED ORDER — PANTOPRAZOLE SODIUM 40 MG PO TBEC
40.0000 mg | DELAYED_RELEASE_TABLET | Freq: Every day | ORAL | Status: DC
  Administered 2015-01-09 – 2015-01-13 (×5): 40 mg via ORAL
  Filled 2015-01-09 (×5): qty 1

## 2015-01-09 MED ORDER — MIDAZOLAM HCL 2 MG/2ML IJ SOLN
INTRAMUSCULAR | Status: AC
  Filled 2015-01-09: qty 2

## 2015-01-09 MED ORDER — HEPARIN SODIUM (PORCINE) 5000 UNIT/ML IJ SOLN: 5000.0000 [IU] | Freq: Three times a day (TID) | INTRAMUSCULAR | Status: DC

## 2015-01-09 MED ORDER — SODIUM CHLORIDE 0.9 % IJ SOLN: 3.0000 mL | INTRAMUSCULAR | Status: DC | PRN

## 2015-01-09 MED ORDER — NITROGLYCERIN 1 MG/10 ML FOR IR/CATH LAB
INTRA_ARTERIAL | Status: AC
  Filled 2015-01-09: qty 10

## 2015-01-09 MED ORDER — ONDANSETRON HCL 4 MG/2ML IJ SOLN: 4.0000 mg | Freq: Four times a day (QID) | INTRAMUSCULAR | Status: DC | PRN

## 2015-01-09 MED ORDER — HEPARIN SODIUM (PORCINE) 1000 UNIT/ML IJ SOLN
INTRAMUSCULAR | Status: DC | PRN
  Administered 2015-01-09: 4000 [IU] via INTRAVENOUS

## 2015-01-09 MED ORDER — LEVOTHYROXINE SODIUM 50 MCG PO TABS
50.0000 ug | ORAL_TABLET | Freq: Every day | ORAL | Status: DC
  Administered 2015-01-09 – 2015-01-13 (×5): 50 ug via ORAL
  Filled 2015-01-09 (×6): qty 1

## 2015-01-09 MED ORDER — SODIUM CHLORIDE 0.9 % WEIGHT BASED INFUSION: 1.0000 mL/kg/h | INTRAVENOUS | Status: AC

## 2015-01-09 MED ORDER — SODIUM CHLORIDE 0.9 % IJ SOLN: 3.0000 mL | Freq: Two times a day (BID) | INTRAMUSCULAR | Status: DC

## 2015-01-09 MED ORDER — HEPARIN BOLUS VIA INFUSION
2000.0000 [IU] | Freq: Once | INTRAVENOUS | Status: AC
  Administered 2015-01-09: 2000 [IU] via INTRAVENOUS
  Filled 2015-01-09: qty 2000

## 2015-01-09 MED ORDER — METOPROLOL TARTRATE 25 MG PO TABS
25.0000 mg | ORAL_TABLET | Freq: Two times a day (BID) | ORAL | Status: DC
  Administered 2015-01-09 – 2015-01-13 (×8): 25 mg via ORAL
  Filled 2015-01-09 (×9): qty 1

## 2015-01-09 MED ORDER — ASPIRIN EC 81 MG PO TBEC: 81.0000 mg | DELAYED_RELEASE_TABLET | Freq: Every day | ORAL | Status: DC

## 2015-01-09 MED ORDER — HEPARIN (PORCINE) IN NACL 2-0.9 UNIT/ML-% IJ SOLN
INTRAMUSCULAR | Status: AC
  Filled 2015-01-09: qty 1000

## 2015-01-09 MED ORDER — CITALOPRAM HYDROBROMIDE 20 MG PO TABS
40.0000 mg | ORAL_TABLET | Freq: Every day | ORAL | Status: DC
  Administered 2015-01-09 – 2015-01-13 (×5): 40 mg via ORAL
  Filled 2015-01-09 (×2): qty 1
  Filled 2015-01-09: qty 2
  Filled 2015-01-09: qty 1
  Filled 2015-01-09: qty 2

## 2015-01-09 MED ORDER — IOHEXOL 350 MG/ML SOLN
INTRAVENOUS | Status: DC | PRN
  Administered 2015-01-09: 70 mL via INTRAVENOUS

## 2015-01-09 MED ORDER — ACETAMINOPHEN 325 MG PO TABS
650.0000 mg | ORAL_TABLET | ORAL | Status: DC | PRN
  Administered 2015-01-10 – 2015-01-12 (×5): 650 mg via ORAL
  Filled 2015-01-09 (×5): qty 2

## 2015-01-09 MED ORDER — ASPIRIN EC 81 MG PO TBEC
81.0000 mg | DELAYED_RELEASE_TABLET | Freq: Every day | ORAL | Status: DC
  Administered 2015-01-09 – 2015-01-10 (×2): 81 mg via ORAL
  Filled 2015-01-09 (×3): qty 1

## 2015-01-09 MED ORDER — VERAPAMIL HCL 2.5 MG/ML IV SOLN
INTRAVENOUS | Status: DC | PRN
  Administered 2015-01-09: 13:00:00 via INTRA_ARTERIAL

## 2015-01-09 SURGICAL SUPPLY — 11 items

## 2015-01-09 NOTE — Progress Notes (Signed)
Interval Converage note: please see cardiology fellow's note over night for H&P  49 yo female with PMH of HTN presented with CP radiate to her L arm and neck around 8pm yesterday after dinner. EKG showed <20mm ST elevation in lateral leads with ST depression in inferior leads which improved on subsequent EKG, however subsequent EKG showed new TWI in anterior leads. Ruled in for NSTEMI, loaded with 600mg  plavix last night, pending cath today  Subjective:  Only mild residual discomfort, otherwise, CP has markedly improved. No SOB. Denies any prior h/o CAD. 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 NSTEMI  - trop 1.45 --> 3.00  - continue on IV heparin. ASA, metoprolol 12.5mg  BID, 80mg  lipitor and plavix  - pending lipid panel.   - I have placed the patient on cath board this morning. Benefit and risk of the procedure include bleeding, vascular/renal injury, arrhythmia, MI, stroke, loss of life and limb explained, she agreed to proceed.    2. HTN  - Will uptitrate metoprolol as tolerated if BP remain in 130s later today. Home lisinopril on hold  Signed, Almyra Deforest PA Pager: [phone] As above, patient admitted with non-ST elevation myocardial infarction. Continue present medications. For cardiac catheterization today. The risks and benefits were discussed and she agrees to proceed. Kirk Ruths

## 2015-01-09 NOTE — Interval H&P Note (Signed)
History and Physical Interval Note:  01/09/2015 12:56 PM  Samantha Lester  has presented today for surgery, with the diagnosis of Nstemi  The various methods of treatment have been discussed with the patient and family. After consideration of risks, benefits and other options for treatment, the patient has consented to  Procedure(s): Left Heart Cath and Coronary Angiography (N/A) as a surgical intervention .  The patient's history has been reviewed, patient examined, no change in status, stable for surgery.  I have reviewed the patient's chart and labs.  Questions were answered to the patient's satisfaction.   Cath Lab Visit (complete for each Cath Lab visit)  Clinical Evaluation Leading to the Procedure:   ACS: Yes.    Non-ACS:    Anginal Classification: CCS IV  Anti-ischemic medical therapy: Minimal Therapy (1 class of medications)  Non-Invasive Test Results: No non-invasive testing performed  Prior CABG: No previous CABG        Collier Salina [facility] 01/09/2015 12:56 PM

## 2015-01-09 NOTE — H&P (Signed)
Referring Physician: Dr. Maryan Rued -- ER  CC: CP, NSTEMI  HPI: 49 yo with history of HTN presented to ER for evaluation of CP. She reports that after dinner while talking to her sister, she developed mid sternal chest pressure at around 8 pm, moderate to severe in intensity, radiating to her left arm and neck, associated with nausea and diaphoresis, lasted over 30 minutes. EMS was called, given ASA 324 mg, had one episode of vomiting en route, and Cp resolved prior to arrival in the ER. EKG suggested less than 1 mm ST elevation in lateral leads with St depression in inferior leads which improved on subsequent EMS EKGs. Never had similar symptoms before although has had heart burn. No known CAD, CVA, DM. She reports fairly well control of her BP except about a week ago when she had headache and SBP was over 190. She reports that sometimes she misses her BP med (Lisinopril).   In the ER, initial EKG at 925 pm was unremarkable but repeat EKG at 1042 pm revealed T in in anterior leads. At that she did have brief recurrence of her symptoms promptly relieved with sl NTG x2. At this point, I was paged for evaluation and requested to load Plavix and start low dose Metoprolol. First istat TropI is 1.45. K 3.4. In the ER, she has been started on heparin infusion.   Upon my evaluation, she remains CP free with normal vitals.    Review of Systems:  10 systems reviewed unremarkable except as noted in HPI   PMH:  HTN  No Known Allergies  History   Social History  . Marital Status: Married    Spouse Name: N/A  . Number of Children: N/A  . Years of Education: N/A   Occupational History  . Not on file.   Social History Main Topics  . Smoking status: Not on file  . Smokeless tobacco: Not on file  . Alcohol Use: Not on file  . Drug Use: Not on file  . Sexual Activity: Not on file   Other Topics Concern  . Not on file   Social History Narrative  . 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 No respiratory difficulty HEENT: normal Neck: supple. no JVD. Carotids 2+ bilat; no bruits. No lymphadenopathy or thryomegaly appreciated. Cor: PMI nondisplaced. Regular rate & rhythm. No rubs, gallops or murmurs. Lungs: clear Abdomen: soft, nontender, nondistended. No hepatosplenomegaly. No bruits or masses. Good bowel sounds. Extremities: no cyanosis, clubbing, rash, edema Neuro: alert & oriented x 3, cranial nerves grossly intact. moves all 4 extremities w/o difficulty. Affect pleasant.  ECG:   Results for orders placed or performed during the hospital encounter of 01/08/15 (from the past 24 hour(s))  CBC     Status: Abnormal   Collection Time: 01/08/15 10:15 PM  Result Value Ref Range   WBC 11.4 (H) 4.0 - 10.5 K/uL   RBC 4.23 3.87 - 5.11 MIL/uL   Hemoglobin 12.5 12.0 - 15.0 g/dL   HCT 37.5 36.0 - 46.0 %   MCV 88.7 78.0 - 100.0 fL   MCH 29.6 26.0 - 34.0 pg   MCHC 33.3 30.0 - 36.0 g/dL   RDW 13.5 11.5 - 15.5 %   Platelets 345 150 - 400 K/uL  Basic metabolic panel     Status: Abnormal   Collection Time: 01/08/15 10:15 PM  Result Value Ref Range   Sodium 138 135 - 145 mmol/L   Potassium 3.4 (L) 3.5 - 5.1 mmol/L   Chloride 106 101 - 111 mmol/L   CO2 22 22 - 32 mmol/L   Glucose, Bld 163 (H) 65 - 99 mg/dL   BUN 11 6 - 20 mg/dL   Creatinine, Ser 0.68 0.44 - 1.00 mg/dL   Calcium 8.7 (L) 8.9 - 10.3 mg/dL   GFR calc non Af Amer >60 >60 mL/min   GFR calc Af Amer >60 >60 mL/min   Anion gap 10 5 - 15  I-stat troponin, ED  (not at [facility])     Status: Abnormal   Collection Time: 01/08/15 10:26 PM  Result Value Ref Range   Troponin i, poc 1.45 (HH) 0.00 - 0.08 ng/mL   Comment NOTIFIED PHYSICIAN    Comment 3           Dg Chest Port 1 View  01/08/2015   CLINICAL DATA:  Substernal chest pain since this evening.  History of hypertension.  EXAM: PORTABLE CHEST - 1 VIEW  COMPARISON:  None.  FINDINGS: The cardiac silhouette is upper limits of normal in size, mediastinal silhouette is nonsuspicious. No pleural effusions or focal consolidation. No pneumothorax. Soft tissue planes and included osseous structures are nonsuspicious.  IMPRESSION: Borderline cardiomegaly, no acute pulmonary process.   Electronically Signed   By: Elon Alas M.D.   On: 01/08/2015 23:55     ASSESSMENT:  1. NSTEMI - CVD risk factors = HTN - No signs or symptoms of HF - Normal Vitals - PVCs much improved   PLAN/DISCUSSION:  Admit to cardiology Continue Heparin infusion  Start Plavix load and then maintenance Start high potency statin and low dose ASA Check echo Plan for cath in 24-48 hours NTG sl prn -- NTG gtt if needed Trend Trop EKG in am and prn   Wandra Mannan, MD Cardiology

## 2015-01-09 NOTE — Progress Notes (Addendum)
UR Completed Maimouna Rondeau Graves-Bigelow, RN,BSN [phone]  

## 2015-01-09 NOTE — Care Management Note (Signed)
Case Management Note  Patient Details  Name: Samantha Lester MRN: [id number] Date of Birth: Dec 13, 1965  Subjective/Objective: Pt admitted for cp with increased troponin. Plan for cardiac cath today.                     Action/Plan: No needs identified by CM at this time.    Expected Discharge Date:                  Expected Discharge Plan:  Home/Self Care  In-House Referral:     Discharge planning Services  CM Consult  Post Acute Care Choice:    Choice offered to:     DME Arranged:    DME Agency:     HH Arranged:    [place] Agency:     Status of Service:  Completed, signed off  Medicare Important Message Given:    Date Medicare IM Given:    Medicare IM give by:    Date Additional Medicare IM Given:    Additional Medicare Important Message give by:     If discussed at [place] of Stay Meetings, dates discussed:    Additional Comments:  Bethena Roys, RN 01/09/2015, 10:07 AM

## 2015-01-09 NOTE — Progress Notes (Signed)
CRITICAL VALUE ALERT  Critical value received: Troponin 3.00  Date of notification:  7/5  Time of notification:  0153  Critical value read back: Yes    Nurse who received alert:  Renita Papa   MD notified (1st page):  Azeem   Time of first page:  0154  Responding MD:  Susy Manor   Time MD responded:  Sabri.Running   MD aware. No new orders received. Will continue to monitor.

## 2015-01-09 NOTE — H&P (View-Only) (Signed)
Interval Converage note: please see cardiology fellow's note over night for H&P  49 yo female with PMH of HTN presented with CP radiate to her L arm and neck around 8pm yesterday after dinner. EKG showed <81mm ST elevation in lateral leads with ST depression in inferior leads which improved on subsequent EKG, however subsequent EKG showed new TWI in anterior leads. Ruled in for NSTEMI, loaded with 600mg  plavix last night, pending cath today  Subjective:  Only mild residual discomfort, otherwise, CP has markedly improved. No SOB. Denies any prior h/o CAD. 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 NSTEMI  - trop 1.45 --> 3.00  - continue on IV heparin. ASA, metoprolol 12.5mg  BID, 80mg  lipitor and plavix  - pending lipid panel.   - I have placed the patient on cath board this morning. Benefit and risk of the procedure include bleeding, vascular/renal injury, arrhythmia, MI, stroke, loss of life and limb explained, she agreed to proceed.    2. HTN  - Will uptitrate metoprolol as tolerated if BP remain in 130s later today. Home lisinopril on hold  Signed, Almyra Deforest PA Pager: [phone] As above, patient admitted with non-ST elevation myocardial infarction. Continue present medications. For cardiac catheterization today. The risks and benefits were discussed and she agrees to proceed. Kirk Ruths

## 2015-01-09 NOTE — Progress Notes (Signed)
Echocardiogram 2D Echocardiogram has been performed.  Samantha Lester 01/09/2015, 9:37 AM

## 2015-01-09 NOTE — Progress Notes (Signed)
ANTICOAGULATION CONSULT NOTE - Follow Up Consult  Pharmacy Consult for heparin Indication: NSTEMI   Labs:  Recent Labs  01/08/15 2215 01/09/15 0028 01/09/15 0628  HGB 12.5  --  12.8  HCT 37.5  --  38.5  PLT 345  --  369  HEPARINUNFRC  --   --  0.13*  CREATININE 0.68  --   --   TROPONINI  --  3.00*  --     Assessment: 48yo female subtherapeutic on heparin with initial dosing for NSTEMI.  Goal of Therapy:  Heparin level 0.3-0.7 units/ml   Plan:  Will rebolus with heparin 2000 units x1 and increase gtt by 3 units/kg/hr to 1100 units/hr and check level in 6hr.  Samantha Lester, PharmD, BCPS  01/09/2015,7:17 AM

## 2015-01-10 ENCOUNTER — Inpatient Hospital Stay (HOSPITAL_COMMUNITY): Payer: 59

## 2015-01-10 ENCOUNTER — Other Ambulatory Visit (HOSPITAL_COMMUNITY): Payer: 59

## 2015-01-10 DIAGNOSIS — I2542 Coronary artery dissection: Secondary | ICD-10-CM

## 2015-01-10 DIAGNOSIS — I214 Non-ST elevation (NSTEMI) myocardial infarction: Principal | ICD-10-CM

## 2015-01-10 LAB — CBC
HCT: 39.3 % (ref 36.0–46.0)
Hemoglobin: 12.9 g/dL (ref 12.0–15.0)
MCH: 29.3 pg (ref 26.0–34.0)
MCHC: 32.8 g/dL (ref 30.0–36.0)
MCV: 89.1 fL (ref 78.0–100.0)
Platelets: 395 10*3/uL (ref 150–400)
RBC: 4.41 MIL/uL (ref 3.87–5.11)
RDW: 14 % (ref 11.5–15.5)
WBC: 10.5 10*3/uL (ref 4.0–10.5)

## 2015-01-10 LAB — SPIROMETRY WITH GRAPH
FEF 25-75 Pre: 3.25 L/sec
FEF2575-%Pred-Pre: 123 %
FEV1-%Pred-Pre: 104 %
FEV1-Pre: 2.66 L
FEV1FVC-%Pred-Pre: 103 %
FEV6-%Pred-Pre: 101 %
FEV6-Pre: 3.18 L
FEV6FVC-%Pred-Pre: 100 %
FVC-%Pred-Pre: 100 %
FVC-Pre: 3.22 L
Pre FEV1/FVC ratio: 83 %
Pre FEV6/FVC Ratio: 99 %

## 2015-01-10 LAB — HEMOGLOBIN A1C
Hgb A1c MFr Bld: 6.3 % — ABNORMAL HIGH (ref 4.8–5.6)
Mean Plasma Glucose: 134 mg/dL

## 2015-01-10 LAB — TROPONIN I: Troponin I: 4.11 ng/mL (ref <0.031)

## 2015-01-10 MED ORDER — ISOSORBIDE MONONITRATE ER 30 MG PO TB24
30.0000 mg | ORAL_TABLET | Freq: Every day | ORAL | Status: DC
Start: 2015-01-10 — End: 2015-01-13
  Administered 2015-01-10 – 2015-01-13 (×4): 30 mg via ORAL
  Filled 2015-01-10 (×5): qty 1

## 2015-01-10 MED ORDER — LISINOPRIL 2.5 MG PO TABS
2.5000 mg | ORAL_TABLET | Freq: Every day | ORAL | Status: DC
  Administered 2015-01-10 – 2015-01-13 (×4): 2.5 mg via ORAL
  Filled 2015-01-10 (×5): qty 1

## 2015-01-10 NOTE — Progress Notes (Signed)
    Subjective:  Denies dyspnea, mild residual chest tightness   Objective:  Filed Vitals:   01/10/15 0400 01/10/15 0500 01/10/15 0600 01/10/15 0700  BP: 149/90 143/84 123/64 167/87  Pulse: 75 65 61 58  Temp: 98.3 F (36.8 C)   98.9 F (37.2 C)  TempSrc: Oral   Oral  Resp: 22 20 21 20   Height: 5\' 1"  (1.549 m)     Weight: 172 lb 13.5 oz (78.4 kg)     SpO2: 96% 96% 97% 98%    Intake/Output from previous day:  Intake/Output Summary (Last 24 hours) at 01/10/15 0741 Last data filed at 01/09/15 2100  Gross per 24 hour  Intake  869.6 ml  Output      0 ml  Net  869.6 ml    Physical Exam: Physical exam: Well-developed well-nourished in no acute distress.  Skin is warm and dry.  HEENT is normal.  Neck is supple.  Chest is clear to auscultation with normal expansion.  Cardiovascular exam is regular rate and rhythm.  Abdominal exam nontender or distended. No masses palpated. Extremities show no edema. Right wrist with small hematoma neuro grossly intact    Lab Results: Basic Metabolic Panel:  Recent Labs  01/08/15 2215 01/09/15 0628 01/09/15 1710  NA 138 138  --   K 3.4* 3.6  --   CL 106 106  --   CO2 22 24  --   GLUCOSE 163* 109*  --   BUN 11 8  --   CREATININE 0.68 0.68 0.71  CALCIUM 8.7* 8.7*  --   MG  --  2.4  --    CBC:  Recent Labs  01/09/15 1710 01/10/15 0227  WBC 8.8 10.5  HGB 12.4 12.9  HCT 37.9 39.3  MCV 88.6 89.1  PLT 370 395   Cardiac Enzymes:  Recent Labs  01/09/15 0628 01/09/15 1710 01/10/15 0227  TROPONINI 4.53* 3.17* 4.11*     Assessment/Plan:  1 non-ST elevation myocardial infarction-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 Cardiothoracic surgery has been consulted for consideration of coronary artery bypass graft. Plavix has been discontinued. Continue aspirin, statin and metoprolol. Add imdur. 2 ischemic cardiomyopathy-continue beta blocker. Add low-dose  ACE inhibitor and advance as needed. 3 hypertension-blood pressure elevated. Add nitrates and ACE inhibitor advance as needed.  Samantha Lester 01/10/2015, 7:41 AM

## 2015-01-10 NOTE — Progress Notes (Signed)
1 Day Post-Op Procedure(s) (LRB): Left Heart Cath and Coronary Angiography (N/A) Subjective: Spontaneous coronary dissection, history of hypertension, history of Raynaud's syndrome  Patient examined, coronary arteriograms and 2-D echocardiogram independently reviewed The patient's symptoms have significant improved since her admission. Cardiac enzymes are trending down, no cardiac arrhythmias Patient has clear symptoms of previous vasospasm of her peripheral vessels The patient received 600 mg Plavix load earlier this week. I would not recommend CABG at this time but would recommend repeat cardiac catheterization later this week and consider adding nifedipine  for her vasospastic symptoms which seem to be significant by her history.   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 [employer]   Review of Systems  General:  No weight loss   no fever   no decreased energy  no night sweats Cardiac:  +Chest pain with exertion + resting chest pain   +SOB with exertion   -Orthopnea                  -PND  -ankle edema  syncope Pulmonary:  no dyspnea,no cough, no productive cough no home oxygen no hemoptysis GI: no difficulty swallowing  no GERD no jaundice  no melena  no hematemesis no        abdominal pain GU:  no dysuria  no hematuria  no frequent UTI no BPH Vascular:  no claudication  No TIA  No varicose veins no DVT ++ history Raynaud syndrome of her fingers and toes Neuro:  no sroke no seizures no TIA no head trauma no vision changes Musculoskeletal:  normal mobility no arthritis  no gout  no joint swelling Skin: no rash  no skin ulceration  no skin cancer Endocrine: no  diabetes  no thyroid didease Hematologic: no easy bruising  no blood transfusions  no frequent epistaxis ENT : no painful teeth no dentures no loose teeth Psych : no anxiety  no depression  o psych hospitalizations        Objective: Vital signs in last 24 hours: Temp:  [98.3 F (36.8 C)-98.9 F (37.2 C)] 98.9 F (37.2 C) (07/06 0700) Pulse Rate:  [0-75] 66 (07/06 0800) Cardiac Rhythm:  [-] Normal sinus rhythm (07/06 0800) Resp:  [0-33] 14 (07/06 0800) BP: (119-170)/(1-109) 151/92 mmHg (07/06 0800) SpO2:  [0 %-99 %] 97 % (07/06 0800) Weight:  [172 lb 13.5 oz (78.4 kg)-178 lb 9.2 oz (81 kg)] 172 lb 13.5 oz (78.4 kg) (07/06 0400)  Hemodynamic parameters for last 24 hours:   nsr, stable BP Intake/Output from previous day: 07/05 0701 - 07/06 0700 In: 869.6 [P.O.:660; I.V.:209.6] Out: -  Intake/Output this shift: Total I/O In: 240 [P.O.:240] Out: -       Physical Exam  General: Pleasant well-kept middle-aged female in no acute distress in the CCU HEENT: Normocephalic pupils equal , dentition adequate Neck: Supple without JVD, adenopathy, or bruit Chest: Clear to auscultation, symmetrical breath sounds, no rhonchi, no tenderness             or deformity Cardiovascular: Regular rate and rhythm, no murmur, no gallop, peripheral pulses             palpable in all extremities Abdomen:  Soft, nontender, no palpable mass or organomegaly Extremities: Warm, well-perfused, no clubbing cyanosis edema or tenderness,              no venous stasis changes of the  legs Rectal/GU: Deferred Neuro: Grossly non--focal and symmetrical throughout Skin: Clean and dry without rash or ulceration   Lab Results:  Recent Labs  01/09/15 1710 01/10/15 0227  WBC 8.8 10.5  HGB 12.4 12.9  HCT 37.9 39.3  PLT 370 395   BMET:  Recent Labs  01/08/15 2215 01/09/15 0628 01/09/15 1710  NA 138 138  --   K 3.4* 3.6  --   CL 106 106  --   CO2 22 24  --   GLUCOSE 163* 109*  --   BUN 11 8  --   CREATININE 0.68 0.68 0.71  CALCIUM 8.7* 8.7*  --     PT/INR:  Recent Labs  01/09/15 0828  LABPROT 14.0  INR 1.06   ABG No results found for: PHART, HCO3, TCO2, ACIDBASEDEF, O2SAT CBG (last 3)  No results for  input(s): GLUCAP in the last 72 hours.  Assessment/Plan: S/P Procedure(s) (LRB): Left Heart Cath and Coronary Angiography (N/A) Continue medical therapy of coronary dissection Consider repeat catheterization later in the week or if symptoms worsen We'll follow   LOS: 2 days    Tharon Aquas Trigt III 01/10/2015

## 2015-01-11 LAB — CBC
HCT: 37.7 % (ref 36.0–46.0)
Hemoglobin: 12.2 g/dL (ref 12.0–15.0)
MCH: 29 pg (ref 26.0–34.0)
MCHC: 32.4 g/dL (ref 30.0–36.0)
MCV: 89.8 fL (ref 78.0–100.0)
Platelets: 386 10*3/uL (ref 150–400)
RBC: 4.2 MIL/uL (ref 3.87–5.11)
RDW: 13.9 % (ref 11.5–15.5)
WBC: 12.8 10*3/uL — AB (ref 4.0–10.5)

## 2015-01-11 LAB — BASIC METABOLIC PANEL
Anion gap: 8 (ref 5–15)
BUN: 16 mg/dL (ref 6–20)
CO2: 27 mmol/L (ref 22–32)
Calcium: 8.9 mg/dL (ref 8.9–10.3)
Chloride: 104 mmol/L (ref 101–111)
Creatinine, Ser: 0.84 mg/dL (ref 0.44–1.00)
GFR calc Af Amer: >60 mL/min (ref >60)
GFR calc non Af Amer: >60 mL/min (ref >60)
Glucose, Bld: 110 mg/dL — ABNORMAL HIGH (ref 65–99)
POTASSIUM: 3.6 mmol/L (ref 3.5–5.1)
SODIUM: 139 mmol/L (ref 135–145)

## 2015-01-11 MED ORDER — SODIUM CHLORIDE 0.9 % WEIGHT BASED INFUSION
1.0000 mL/kg/h | INTRAVENOUS | Status: DC
  Administered 2015-01-11 – 2015-01-12 (×2): 1 mL/kg/h via INTRAVENOUS

## 2015-01-11 MED ORDER — SODIUM CHLORIDE 0.9 % IJ SOLN
3.0000 mL | Freq: Two times a day (BID) | INTRAMUSCULAR | Status: DC
  Administered 2015-01-12: 3 mL via INTRAVENOUS

## 2015-01-11 MED ORDER — ASPIRIN EC 81 MG PO TBEC
81.0000 mg | DELAYED_RELEASE_TABLET | Freq: Once | ORAL | Status: AC
  Administered 2015-01-11: 81 mg via ORAL
  Filled 2015-01-11: qty 1

## 2015-01-11 MED ORDER — SODIUM CHLORIDE 0.9 % IV SOLN: 250.0000 mL | INTRAVENOUS | Status: DC | PRN

## 2015-01-11 MED ORDER — ASPIRIN 81 MG PO CHEW: 81.0000 mg | CHEWABLE_TABLET | ORAL | Status: AC

## 2015-01-11 MED ORDER — SODIUM CHLORIDE 0.9 % WEIGHT BASED INFUSION: 1.0000 mL/kg/h | INTRAVENOUS | Status: DC

## 2015-01-11 MED ORDER — SODIUM CHLORIDE 0.9 % IJ SOLN: 3.0000 mL | INTRAMUSCULAR | Status: DC | PRN

## 2015-01-11 MED ORDER — ASPIRIN EC 81 MG PO TBEC
81.0000 mg | DELAYED_RELEASE_TABLET | Freq: Every day | ORAL | Status: DC
  Administered 2015-01-12 – 2015-01-13 (×2): 81 mg via ORAL
  Filled 2015-01-11: qty 1

## 2015-01-11 MED FILL — Nitroglycerin IV Soln 100 MCG/ML in D5W: INTRA_ARTERIAL | Qty: 10 | Status: AC

## 2015-01-11 MED FILL — Heparin Sodium (Porcine) 2 Unit/ML in Sodium Chloride 0.9%: INTRAMUSCULAR | Qty: 1000 | Status: AC

## 2015-01-11 MED FILL — Lidocaine HCl Local Preservative Free (PF) Inj 1%: INTRAMUSCULAR | Qty: 30 | Status: AC

## 2015-01-11 NOTE — Progress Notes (Addendum)
CARDIAC REHAB PHASE I   PRE:  Rate/Rhythm: 34 SR  BP:  Sitting: 94/54        SaO2: 96 Ra  MODE:  Ambulation: 700 ft   POST:  Rate/Rhythm: 66 SR  BP:  Sitting: 99/56         SaO2: 96 RA  Pt states she has been ambulating independently around the unit, feeling fine. Pt BP slightly low, pt denies any associated symptoms.  Pt ambulated 700 ft on RA, independent, steady gait, tolerated well. Pt denies CP, dizziness, DOE, declined rest stop. Pt did c/o mild headache, states she thinks it's because she isn't eating as much salt or sugar as she's used to. Gave pt MI book to review, pt verbalized understanding. Pt to bed per pt request after walk, call bell within reach. Will follow-up post cath tomorrow.   [phone]  Lenna Sciara, RN, BSN 01/11/2015 2:35 PM

## 2015-01-11 NOTE — Progress Notes (Signed)
    Subjective:  Denies dyspnea or chest tightness   Objective:  Filed Vitals:   01/11/15 0010 01/11/15 0412 01/11/15 0600 01/11/15 0738  BP: 102/61 99/65    Pulse:      Temp:  98.6 F (37 C)  98.3 F (36.8 C)  TempSrc:  Oral  Oral  Resp:      Height:   5\' 1"  (1.549 m)   Weight:   174 lb 9.7 oz (79.2 kg)   SpO2:  98%      Intake/Output from previous day:  Intake/Output Summary (Last 24 hours) at 01/11/15 0754 Last data filed at 01/10/15 1800  Gross per 24 hour  Intake    840 ml  Output      0 ml  Net    840 ml    Physical Exam: Physical exam: Well-developed well-nourished in no acute distress.  Skin is warm and dry.  HEENT is normal.  Neck is supple.  Chest is clear to auscultation with normal expansion.  Cardiovascular exam is regular rate and rhythm.  Abdominal exam nontender or distended. No masses palpated. Extremities show no edema. Right wrist with small hematoma neuro grossly intact    Lab Results: Basic Metabolic Panel:  Recent Labs  01/09/15 0628 01/09/15 1710 01/11/15 0230  NA 138  --  139  K 3.6  --  3.6  CL 106  --  104  CO2 24  --  27  GLUCOSE 109*  --  110*  BUN 8  --  16  CREATININE 0.68 0.71 0.84  CALCIUM 8.7*  --  8.9  MG 2.4  --   --    CBC:  Recent Labs  01/10/15 0227 01/11/15 0230  WBC 10.5 12.8*  HGB 12.9 12.2  HCT 39.3 37.7  MCV 89.1 89.8  PLT 395 386   Cardiac Enzymes:  Recent Labs  01/09/15 0628 01/09/15 1710 01/10/15 0227  TROPONINI 4.53* 3.17* 4.11*     Assessment/Plan:  1 non-ST elevation myocardial infarction-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 Cardiothoracic surgery has reviewed. Dr Prescott Gum feels coronary artery bypass graft not indicated at present. Our plan will be to proceed with repeat cardiac catheterization tomorrow to see if there has been some improvement. The risks and benefits were discussed and patient agrees to  proceed. Continue ASA, nitrates, lopressor and statin. Discussed with Dr Martinique and Dr Burt Knack. 2 ischemic cardiomyopathy-continue beta blocker and ACE inhibitor. 3 hypertension-blood pressure improved; continue present meds.   Kirk Ruths 01/11/2015, 7:54 AM

## 2015-01-12 ENCOUNTER — Encounter (HOSPITAL_COMMUNITY)
Admission: EM | Disposition: A | Discharge status: Home / Self Care | Payer: Self-pay | Source: Home / Self Care | Attending: Cardiovascular Disease

## 2015-01-12 HISTORY — PX: CARDIAC CATHETERIZATION: SHX172

## 2015-01-12 LAB — CBC
HEMATOCRIT: 36 % (ref 36.0–46.0)
Hemoglobin: 11.6 g/dL — ABNORMAL LOW (ref 12.0–15.0)
MCH: 29.2 pg (ref 26.0–34.0)
MCHC: 32.2 g/dL (ref 30.0–36.0)
MCV: 90.7 fL (ref 78.0–100.0)
Platelets: 346 10*3/uL (ref 150–400)
RBC: 3.97 MIL/uL (ref 3.87–5.11)
RDW: 13.7 % (ref 11.5–15.5)
WBC: 11.9 10*3/uL — ABNORMAL HIGH (ref 4.0–10.5)

## 2015-01-12 SURGERY — LEFT HEART CATH AND CORONARY ANGIOGRAPHY
Anesthesia: LOCAL

## 2015-01-12 SURGERY — CORONARY ARTERY BYPASS GRAFTING (CABG)
Anesthesia: General | Site: Chest

## 2015-01-12 MED ORDER — LIDOCAINE HCL (PF) 1 % IJ SOLN
INTRAMUSCULAR | Status: DC | PRN
Start: 2015-01-12 — End: 2015-01-12
  Administered 2015-01-12: 2 mL via INTRADERMAL

## 2015-01-12 MED ORDER — MIDAZOLAM HCL 2 MG/2ML IJ SOLN
INTRAMUSCULAR | Status: AC
  Filled 2015-01-12: qty 2

## 2015-01-12 MED ORDER — SODIUM CHLORIDE 0.9 % IV SOLN
INTRAVENOUS | Status: DC | PRN
  Administered 2015-01-12: 79 mL/h via INTRAVENOUS

## 2015-01-12 MED ORDER — FENTANYL CITRATE (PF) 100 MCG/2ML IJ SOLN
INTRAMUSCULAR | Status: AC
  Filled 2015-01-12: qty 2

## 2015-01-12 MED ORDER — SODIUM CHLORIDE 0.9 % IJ SOLN: 3.0000 mL | INTRAMUSCULAR | Status: DC | PRN

## 2015-01-12 MED ORDER — MIDAZOLAM HCL 2 MG/2ML IJ SOLN
INTRAMUSCULAR | Status: DC | PRN
Start: 2015-01-12 — End: 2015-01-12
  Administered 2015-01-12: 2 mg via INTRAVENOUS

## 2015-01-12 MED ORDER — LIDOCAINE HCL (PF) 1 % IJ SOLN
INTRAMUSCULAR | Status: AC
  Filled 2015-01-12: qty 30

## 2015-01-12 MED ORDER — IOHEXOL 350 MG/ML SOLN
INTRAVENOUS | Status: DC | PRN
  Administered 2015-01-12: 30 mL via INTRAVENOUS

## 2015-01-12 MED ORDER — NITROGLYCERIN 1 MG/10 ML FOR IR/CATH LAB
INTRA_ARTERIAL | Status: DC | PRN
  Administered 2015-01-12: 15:00:00

## 2015-01-12 MED ORDER — HEPARIN SODIUM (PORCINE) 1000 UNIT/ML IJ SOLN
INTRAMUSCULAR | Status: AC
  Filled 2015-01-12: qty 1

## 2015-01-12 MED ORDER — SODIUM CHLORIDE 0.9 % IJ SOLN
3.0000 mL | Freq: Two times a day (BID) | INTRAMUSCULAR | Status: DC
  Administered 2015-01-12 – 2015-01-13 (×2): 3 mL via INTRAVENOUS

## 2015-01-12 MED ORDER — HEPARIN (PORCINE) IN NACL 2-0.9 UNIT/ML-% IJ SOLN
INTRAMUSCULAR | Status: AC
  Filled 2015-01-12: qty 1500

## 2015-01-12 MED ORDER — FENTANYL CITRATE (PF) 100 MCG/2ML IJ SOLN
INTRAMUSCULAR | Status: DC | PRN
  Administered 2015-01-12: 25 ug via INTRAVENOUS

## 2015-01-12 MED ORDER — SODIUM CHLORIDE 0.9 % IV SOLN: 250.0000 mL | INTRAVENOUS | Status: DC | PRN

## 2015-01-12 MED ORDER — SODIUM CHLORIDE 0.9 % IV SOLN
INTRAVENOUS | Status: AC
  Administered 2015-01-12: 400 mL via INTRAVENOUS

## 2015-01-12 MED ORDER — HEPARIN SODIUM (PORCINE) 1000 UNIT/ML IJ SOLN
INTRAMUSCULAR | Status: DC | PRN
Start: 2015-01-12 — End: 2015-01-12
  Administered 2015-01-12: 4000 [IU] via INTRAVENOUS

## 2015-01-12 MED ORDER — VERAPAMIL HCL 2.5 MG/ML IV SOLN
INTRAVENOUS | Status: AC
  Filled 2015-01-12: qty 2

## 2015-01-12 MED ORDER — VERAPAMIL HCL 2.5 MG/ML IV SOLN
INTRAVENOUS | Status: DC | PRN
  Administered 2015-01-12: 15:00:00 via INTRA_ARTERIAL

## 2015-01-12 MED ORDER — NITROGLYCERIN 1 MG/10 ML FOR IR/CATH LAB
INTRA_ARTERIAL | Status: AC
  Filled 2015-01-12: qty 10

## 2015-01-12 SURGICAL SUPPLY — 12 items
CATH INFINITI 5 FR JL3.5 (CATHETERS) ×2
CATH INFINITI 5FR MULTPACK ANG (CATHETERS)
CATH INFINITI JR4 5F (CATHETERS) ×2
DEVICE RAD COMP TR BAND LRG (VASCULAR PRODUCTS) ×2
GLIDESHEATH SLEND SS 6F .021 (SHEATH) ×2
KIT HEART LEFT (KITS) ×2
PACK CARDIAC CATHETERIZATION (CUSTOM PROCEDURE TRAY) ×2
SHEATH PINNACLE 5F 10CM (SHEATH)
TRANSDUCER W/STOPCOCK (MISCELLANEOUS) ×2
TUBING CIL FLEX 10 FLL-RA (TUBING) ×2
WIRE EMERALD 3MM-J .035X150CM (WIRE)
WIRE SAFE-T 1.5MM-J .035X260CM (WIRE) ×2

## 2015-01-12 NOTE — Progress Notes (Signed)
1145 Observed pt up ambulating independently in hall way. We will follow up after cath. Graylon Good RN BSN 01/12/2015 11:45 AM

## 2015-01-12 NOTE — Progress Notes (Signed)
    Subjective:  Denies dyspnea or chest tightness   Objective:  Filed Vitals:   01/11/15 1956 01/12/15 0016 01/12/15 0500 01/12/15 0508  BP: 105/60   111/53  Pulse:  60    Temp: 98.2 F (36.8 C) 98.1 F (36.7 C)  98.1 F (36.7 C)  TempSrc: Oral Oral  Oral  Resp: 18 16  16   Height:      Weight:   177 lb 4 oz (80.4 kg)   SpO2: 100% 98%  100%    Intake/Output from previous day:  Intake/Output Summary (Last 24 hours) at 01/12/15 0735 Last data filed at 01/12/15 0500  Gross per 24 hour  Intake   1096 ml  Output      0 ml  Net   1096 ml    Physical Exam: Physical exam: Well-developed well-nourished in no acute distress.  Skin is warm and dry.  HEENT is normal.  Neck is supple.  Chest is clear to auscultation with normal expansion.  Cardiovascular exam is regular rate and rhythm.  Abdominal exam nontender or distended. No masses palpated. Extremities show no edema.  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 Dr Prescott Gum feels coronary artery bypass graft not indicated at present. Our plan is to proceed with repeat cardiac catheterization today to see if there has been some improvement and help with decision concerning medical therapy vs need for revascularization. The risks and benefits were previously discussed and patient agreed to proceed. Continue ASA, nitrates, lopressor and  statin.  2 ischemic cardiomyopathy-continue beta blocker and ACE inhibitor. 3 hypertension-blood pressure improved; continue present meds.   Kirk Ruths 01/12/2015, 7:35 AM

## 2015-01-12 NOTE — Progress Notes (Signed)
UR Completed Licet Dunphy Graves-Bigelow, RN,BSN [phone]  

## 2015-01-12 NOTE — H&P (View-Only) (Signed)
    Subjective:  Denies dyspnea or chest tightness   Objective:  Filed Vitals:   01/11/15 1956 01/12/15 0016 01/12/15 0500 01/12/15 0508  BP: 105/60   111/53  Pulse:  60    Temp: 98.2 F (36.8 C) 98.1 F (36.7 C)  98.1 F (36.7 C)  TempSrc: Oral Oral  Oral  Resp: 18 16  16   Height:      Weight:   177 lb 4 oz (80.4 kg)   SpO2: 100% 98%  100%    Intake/Output from previous day:  Intake/Output Summary (Last 24 hours) at 01/12/15 0735 Last data filed at 01/12/15 0500  Gross per 24 hour  Intake   1096 ml  Output      0 ml  Net   1096 ml    Physical Exam: Physical exam: Well-developed well-nourished in no acute distress.  Skin is warm and dry.  HEENT is normal.  Neck is supple.  Chest is clear to auscultation with normal expansion.  Cardiovascular exam is regular rate and rhythm.  Abdominal exam nontender or distended. No masses palpated. Extremities show no edema.  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 Dr Prescott Gum feels coronary artery bypass graft not indicated at present. Our plan is to proceed with repeat cardiac catheterization today to see if there has been some improvement and help with decision concerning medical therapy vs need for revascularization. The risks and benefits were previously discussed and patient agreed to proceed. Continue ASA, nitrates, lopressor and  statin.  2 ischemic cardiomyopathy-continue beta blocker and ACE inhibitor. 3 hypertension-blood pressure improved; continue present meds.   Kirk Ruths 01/12/2015, 7:35 AM

## 2015-01-12 NOTE — Interval H&P Note (Signed)
History and Physical Interval Note:  01/12/2015 2:55 PM  Samantha Lester  has presented today for surgery, with the diagnosis of cp  The various methods of treatment have been discussed with the patient and family. After consideration of risks, benefits and other options for treatment, the patient has consented to  Procedure(s): Left Heart Cath and Coronary Angiography (N/A) as a surgical intervention .  The patient's history has been reviewed, patient examined, no change in status, stable for surgery.  I have reviewed the patient's chart and labs.  Questions were answered to the patient's satisfaction.     Sherren Mocha

## 2015-01-13 MED ORDER — METOPROLOL TARTRATE 25 MG PO TABS: 25.0000 mg | ORAL_TABLET | Freq: Two times a day (BID) | ORAL | Status: DC

## 2015-01-13 MED ORDER — ATORVASTATIN CALCIUM 80 MG PO TABS: 80.0000 mg | ORAL_TABLET | Freq: Every day | ORAL | Status: DC

## 2015-01-13 MED ORDER — LISINOPRIL 2.5 MG PO TABS: 2.5000 mg | ORAL_TABLET | Freq: Every day | ORAL | Status: DC

## 2015-01-13 MED ORDER — NITROGLYCERIN 0.4 MG SL SUBL: 0.4000 mg | SUBLINGUAL_TABLET | SUBLINGUAL | Status: DC | PRN

## 2015-01-13 MED ORDER — ISOSORBIDE MONONITRATE ER 30 MG PO TB24: 30.0000 mg | ORAL_TABLET | Freq: Every day | ORAL | Status: DC

## 2015-01-13 MED ORDER — NITROGLYCERIN 0.4 MG SL SUBL: 0.4000 mg | SUBLINGUAL_TABLET | SUBLINGUAL | Status: AC | PRN

## 2015-01-13 MED ORDER — ASPIRIN 81 MG PO TBEC
81.0000 mg | DELAYED_RELEASE_TABLET | Freq: Every day | ORAL | Status: DC
Start: 2015-01-13 — End: 2018-04-27

## 2015-01-13 NOTE — Progress Notes (Signed)
Subjective:  Some anxiety.  Vague chest tightness today but ambulatory with rehabilitation without tightness.  No shortness of breath.  Objective:  Vital Signs in the last 24 hours: BP 122/68 mmHg  Pulse 76  Temp(Src) 98.2 F (36.8 C) (Oral)  Resp 18  Ht 5\' 1"  (1.549 m)  Wt 80.287 kg (177 lb)  BMI 33.46 kg/m2  SpO2 97%  LMP  (LMP Unknown)  Physical Exam: Pleasant female in no acute distress Lungs:  Clear Cardiac:  Regular rhythm, normal S1 and S2, no S3 Extremities: Cath site clean and dry  Intake/Output from previous day: 07/08 0701 - 07/09 0700 In: 733.6 [I.V.:733.6] Out: -   Weight Filed Weights   01/11/15 0600 01/12/15 0500 01/13/15 0500  Weight: 79.2 kg (174 lb 9.7 oz) 80.4 kg (177 lb 4 oz) 80.287 kg (177 lb)    Lab Results: Basic Metabolic Panel:  Recent Labs  01/11/15 0230  NA 139  K 3.6  CL 104  CO2 27  GLUCOSE 110*  BUN 16  CREATININE 0.84   CBC:  Recent Labs  01/11/15 0230 01/12/15 0303  WBC 12.8* 11.9*  HGB 12.2 11.6*  HCT 37.7 36.0  MCV 89.8 90.7  PLT 386 346   Telemetry: Sinus rhythm.  Assessment/Plan:  1.  Non-STEMI thought due to coronary dissection relatively stable at catheterization yesterday -the catheterization showed improved stenosis from yesterday 2.  Hypertension  Recommendations:  Okay for discharge today.  Will need to be on beta blockers, nitrates to prevent spasm, lipid-lowering therapy, and aspirin on discharge.  Should follow-up with Dr. Stanford Breed in the next week or so.      Kerry Hough  MD [facility]  01/13/2015, 10:53 AM

## 2015-01-13 NOTE — Discharge Summary (Signed)
CARDIOLOGY DISCHARGE SUMMARY   Patient ID: Samantha Lester MRN: [id number] DOB/AGE: Sep 13, 1965 49 y.o.  Admit date: 01/08/2015 Discharge date: 01/13/2015  PCP: No primary care provider on file. Primary Cardiologist: Dr. Stanford Breed  Primary Discharge Diagnosis:  Non-STEMI Secondary Discharge Diagnosis:    Essential hypertension   Coronary artery dissection  Consult : Dr. Prescott Gum  Procedures: Cardiac catheterization, coronary arteriogram, left ventriculogram, 2-D echocardiogram  Hospital Course: Samantha Lester is a 49 y.o. female with a history of hypertension and ADD, but no previous history of cardiac issues. She developed chest pain associated with radiation to her left arm and neck, nausea and vomiting. She had dynamic ST changes. She was brought to the emergency room where her initial troponin was elevated. She was started on heparin, loaded with Plavix, given aspirin and a beta blocker. She was admitted for further evaluation and treatment.  Her cardiac enzymes indicated a non-STEMI. She was taken to the cath lab on 01/09/2015, results are below. 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 However, it is of the left main and proximal circumflex/OM involvement, a surgical consult was called.  Dr. Prescott Gum felt that she had clear symptoms of previous vasospasm of her peripheral vessels. He did not recommend bypass surgery at this time, but did recommend repeat cardiac catheterization in a few days to follow the progress and considering nifedipine.  She continued to make progress and was seen by cardiac rehabilitation. She was educated on MI restrictions and exercise guidelines as well as a heart-healthy lifestyle.  Back to the cath lab on 01/12/2015. Results are below. The catheterization showed improved stenosis from her initial catheterization and it was felt that she was progressing well and no further intervention was needed.  Her  Adderall was held on admission. Because of the coronary artery dissection and history of spasm, this medication is discontinued and she is to consult with her primary care physician regarding management of her ADD.  On 01/13/2015, she was seen by Dr. Wynonia Lawman and all data were reviewed. He recommended that she be on beta blockers, nitrates, aspirin and a statin. She was ambulating without chest pain and is considered stable for discharge, to follow up as an outpatient.  Labs:   Lab Results  Component Value Date   WBC 11.9* 01/12/2015   HGB 11.6* 01/12/2015   HCT 36.0 01/12/2015   MCV 90.7 01/12/2015   PLT 346 01/12/2015     Recent Labs Lab 01/11/15 0230  NA 139  K 3.6  CL 104  CO2 27  BUN 16  CREATININE 0.84  CALCIUM 8.9  GLUCOSE 110*   Lab Results  Component Value Date   TROPONINI 4.11* 01/10/2015   Lipid Panel     Component Value Date/Time   CHOL 210* 01/09/2015 0628   TRIG 135 01/09/2015 0628   HDL 43 01/09/2015 0628   CHOLHDL 4.9 01/09/2015 0628   VLDL 27 01/09/2015 0628   LDLCALC 140* 01/09/2015 0628     Radiology: Dg Chest Port 1 View 01/08/2015   CLINICAL DATA:  Substernal chest pain since this evening. History of hypertension.  EXAM: PORTABLE CHEST - 1 VIEW  COMPARISON:  None.  FINDINGS: The cardiac silhouette is upper limits of normal in size, mediastinal silhouette is nonsuspicious. No pleural effusions or focal consolidation. No pneumothorax. Soft tissue planes and included osseous structures are nonsuspicious.  IMPRESSION: Borderline cardiomegaly, no acute pulmonary process.   Electronically Signed   By: Thana Farr.D.  On: 01/08/2015 23:55    Cardiac Cath: 01/09/2015 1. LM lesion, 20% stenosed. 2. Prox LAD lesion, 45% stenosed. 3. Prox Cx lesion, 35% stenosed. 4. 1st Mrg lesion, 90% stenosed. 5. There is mild to moderate left ventricular systolic dysfunction.  Coronary angiography demonstrates classic features of spontaneous coronary dissection  (SCAD). There is involvement of the distal left main into the proximal LAD and proximal LCX with severe involvement of a large first OM. There is sparing of the mid to distal LAD. The OM distal to the severe stenosis appears normal.  There is moderate LV dysfunction with anterior wall motion abnormality.  Ordinarily would recommend medical therapy for SCAD but involvement of the left main and proximal LAD and LCx places this patient at elevated risk for a conservative approach. Will review angiographic films with my colleagues and obtain a CT surgery consult. Patient will be monitored in CCU. Plavix was discontinued. Will DC IV heparin since there is no proven benefit in SCAD patients.   Repeat cardiac cath: 01/12/2015 6. Ost LAD lesion, 30% stenosed. 7. 1st Mrg lesion, 75% stenosed. 8. Prox LAD lesion, 30% stenosed. 9. Dist Cx lesion, 80% stenosed. Final conclusion:  Diffuse moderate to severe stenosis of a large first obtuse marginal branch, improved from previous angiogram  Mild nonobstructive appearing proximal LAD stenosis, also improved from previous angiogram.  Patent left main  Normal RCA The patient's coronary angiogram demonstrates improvement in the appearance of typical findings of coronary dissection. This suggests early healing. Seems reasonable to continue with an approach of medical therapy. As long as she remains stable, anticipate hospital discharge tomorrow.   EKG: 01/10/2015 Sinus rhythm, anterior T-wave changes consistent with recent MI Vent. rate 66 BPM PR interval 140 ms QRS duration 90 ms QT/QTc 492/515 ms  prolonged QT/QTC P-R-T axes 20 -22 99  Echo: 01/09/2015 Conclusions - Left ventricle: The cavity size was normal. Wall thickness was increased in a pattern of mild LVH. Systolic function was normal. The estimated ejection fraction was in the range of 55% to 60%. Wall motion was normal; there were no regional wall motion abnormalities. Left  ventricular diastolic function parameters were normal. - Mitral valve: There was mild regurgitation.  FOLLOW UP PLANS AND APPOINTMENTS No Known Allergies   Medication List    STOP taking these medications        amphetamine-dextroamphetamine 5 MG tablet  Commonly known as:  ADDERALL      TAKE these medications        aspirin 81 MG EC tablet  Take 1 tablet (81 mg total) by mouth daily.     atorvastatin 80 MG tablet  Commonly known as:  LIPITOR  Take 1 tablet (80 mg total) by mouth daily at 6 PM.     citalopram 40 MG tablet  Commonly known as:  CELEXA  Take 40 mg by mouth daily.     famotidine 20 MG tablet  Commonly known as:  PEPCID  Take 20 mg by mouth daily as needed for heartburn or indigestion.     isosorbide mononitrate 30 MG 24 hr tablet  Commonly known as:  IMDUR  Take 1 tablet (30 mg total) by mouth daily.     levothyroxine 50 MCG tablet  Commonly known as:  SYNTHROID, LEVOTHROID  Take 50 mcg by mouth daily.     lisinopril 2.5 MG tablet  Commonly known as:  PRINIVIL,ZESTRIL  Take 1 tablet (2.5 mg total) by mouth daily.     metoprolol tartrate 25 MG  tablet  Commonly known as:  LOPRESSOR  Take 1 tablet (25 mg total) by mouth 2 (two) times daily.     nitroGLYCERIN 0.4 MG SL tablet  Commonly known as:  NITROSTAT  Place 1 tablet (0.4 mg total) under the tongue every 5 (five) minutes x 3 doses as needed for chest pain.        Discharge Instructions    Amb Referral to Cardiac Rehabilitation    Complete by:  As directed   Congestive Heart Failure: If diagnosis is Heart Failure, patient MUST meet each of the CMS criteria: 1. Left Ventricular Ejection Fraction </= 35% 2. NYHA class II-IV symptoms despite being on optimal heart failure therapy for at least 6 weeks. 3. Stable = have not had a recent (<6 weeks) or planned (<6 months) major cardiovascular hospitalization or procedure  Program Details: - Physician supervised classes - 1-3 classes per week  over a 12-18 week period, generally for a total of 36 sessions  Physician Certification: I certify that the above Cardiac Rehabilitation treatment is medically necessary and is medically approved by me for treatment of this patient. The patient is willing and cooperative, able to ambulate and medically stable to participate in exercise rehabilitation. The participant's progress and Individualized Treatment Plan will be reviewed by the Medical Director, Cardiac Rehab staff and as indicated by the Referring/Ordering Physician.  Diagnosis:  Myocardial Infarction     Diet - low sodium heart healthy    Complete by:  As directed      Increase activity slowly    Complete by:  As directed           Follow-up Information    Follow up with Kirk Ruths, MD.   Specialty:  Cardiology   Why:  The office will call with an appointment with MD/PA/NP within 2 weeks. Please arrive 15 minutes early for paperwork.   Contact information:   [address] [place] [address] [phone]       BRING ALL MEDICATIONS WITH YOU TO FOLLOW UP APPOINTMENTS  Time spent with patient to include physician time: 39 min Signed: Rosaria Ferries, PA-C 01/13/2015, 12:17 PM Co-Sign MD

## 2015-01-13 NOTE — Progress Notes (Signed)
CARDIAC REHAB PHASE I   PRE:  Rate/Rhythm: 61 SR    BP: sitting 123/74    SaO2:   MODE:  Ambulation: 550 ft   POST:  Rate/Rhythm: 74 SR    BP: sitting 130/80     SaO2:   Tolerated well, feels good. Ed completed with pt with good understanding. Interested in [place] and will send referral to [place]. Also discussed elevated Hgb A1C and diet change. [id number]   Josephina Shih Belmond CES, ACSM 01/13/2015 12:03 PM

## 2015-01-13 NOTE — Discharge Instructions (Signed)
PLEASE REMEMBER TO BRING ALL OF YOUR MEDICATIONS TO EACH OF YOUR FOLLOW-UP OFFICE VISITS. ° °PLEASE ATTEND ALL SCHEDULED FOLLOW-UP APPOINTMENTS.  ° °Activity: Increase activity slowly as tolerated. You may shower, but no soaking baths (or swimming) for 1 week. No driving for 2 days. No lifting over 5 lbs for 1 week. No sexual activity for 1 week.  ° °You May Return to Work: in 1 week (if applicable) ° °Wound Care: You may wash cath site gently with soap and water. Keep cath site clean and dry. If you notice pain, swelling, bleeding or pus at your cath site, please call [phone]. ° ° ° °Cardiac Cath Site Care °Refer to this sheet in the next few weeks. These instructions provide you with information on caring for yourself after your procedure. Your caregiver may also give you more specific instructions. Your treatment has been planned according to current medical practices, but problems sometimes occur. Call your caregiver if you have any problems or questions after your procedure. °HOME CARE INSTRUCTIONS °· You may shower 24 hours after the procedure. Remove the bandage (dressing) and gently wash the site with plain soap and water. Gently pat the site dry.  °· Do not apply powder or lotion to the site.  °· Do not sit in a bathtub, swimming pool, or whirlpool for 5 to 7 days.  °· No bending, squatting, or lifting anything over 10 pounds (4.5 kg) as directed by your caregiver.  °· Inspect the site at least twice daily.  °· Do not drive home if you are discharged the same day of the procedure. Have someone else drive you.  °· You may drive 24 hours after the procedure unless otherwise instructed by your caregiver.  °What to expect: °· Any bruising will usually fade within 1 to 2 weeks.  °· Blood that collects in the tissue (hematoma) may be painful to the touch. It should usually decrease in size and tenderness within 1 to 2 weeks.  °SEEK IMMEDIATE MEDICAL CARE IF: °· You have unusual pain at the site or down the  affected limb.  °· You have redness, warmth, swelling, or pain at the site.  °· You have drainage (other than a small amount of blood on the dressing).  °· You have chills.  °· You have a fever or persistent symptoms for more than 72 hours.  °· You have a fever and your symptoms suddenly get worse.  °· Your leg becomes pale, cool, tingly, or numb.  °· You have heavy bleeding from the site. Hold pressure on the site.  °Document Released: 07/26/2010 Document Revised: 06/12/2011 Document Reviewed: 07/26/2010 °ExitCare® Patient Information ©2012 [employer]. ° °

## 2015-01-15 ENCOUNTER — Encounter (HOSPITAL_COMMUNITY): Payer: Self-pay | Admitting: Cardiovascular Disease

## 2015-01-17 ENCOUNTER — Telehealth: Payer: Self-pay | Admitting: Cardiology

## 2015-01-17 NOTE — Telephone Encounter (Signed)
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 States she wanted to know how long it takes for artery to heal.She does not feel like she is ready to lift surgical trays and hold retractors.Post hospital appointment scheduled with Dr.Crenshaw 02/05/15 at 10:45 am.Advised I will send message to [facility] for advice.

## 2015-01-17 NOTE — Telephone Encounter (Signed)
Pt called in stating that she had a cath placed on 7/4 and needed  to follow up with the MD or the PA in 2 weeks. I informed her that our next PA appt with going to be 7/29 and she was fine with that but she was concerned about going back to work. 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 She would like to be advised on what to do. Please call  Thanks

## 2015-01-17 NOTE — Telephone Encounter (Signed)
Would stay out of work until 7/25 [employer]

## 2015-01-18 NOTE — Telephone Encounter (Signed)
Returned call to patient Dr.Crenshaw advised to stay out of work until 01/29/15.Advised to call back if needed.

## 2015-01-18 NOTE — Telephone Encounter (Signed)
Returned call to patient no answer.LMTC. 

## 2015-01-19 ENCOUNTER — Telehealth: Payer: Self-pay | Admitting: Cardiology

## 2015-01-19 NOTE — Telephone Encounter (Signed)
01/19/2015 Received Physicians Statement Form from [place] to be filled out by Dr. Stanford Breed on patient.  I had to call patient to come by our office to fill out our release and to bring 25.00 check which we received, I then made copy of form and check for our records, then  put release in system put in bag to go to our [place] office to process.

## 2015-01-23 ENCOUNTER — Telehealth: Payer: Self-pay | Admitting: *Deleted

## 2015-01-23 NOTE — Telephone Encounter (Signed)
Rehab orders faxed

## 2015-01-24 ENCOUNTER — Telehealth: Payer: Self-pay | Admitting: Cardiology

## 2015-01-24 ENCOUNTER — Encounter: Payer: Self-pay | Admitting: *Deleted

## 2015-01-24 NOTE — Telephone Encounter (Signed)
Spoke to patient.  Dr Stanford Breed had OK'd her out of work until 7/25 following cath (see previous encounter)  Pt informed can email or give physical copy - she states she will come pick up. Informed letter made available at front desk for pickup. Pt verbalized understanding.

## 2015-01-24 NOTE — Telephone Encounter (Signed)
Pt need a note to return back to work on 01-29-15 please. Please e-,ail this note to [email or web address]

## 2015-02-01 NOTE — Progress Notes (Signed)
      HPI: FU CAD. Recently admitted with NSTEMI. Echo 7/16 showed normal LV function and mild MR. 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 EF 40-45. CVTS saw patient but felt CABG not indicated. Patient treated medically. FU cath 01/12/15 showed improvement of dissection with residual 75 OM1 and 80 distal Lcx. Since DC, the patient denies any dyspnea on exertion, orthopnea, PND, pedal edema, palpitations, syncope or chest pain.   Current Outpatient Prescriptions  Medication Sig Dispense Refill  . aspirin EC 81 MG EC tablet Take 1 tablet (81 mg total) by mouth daily.    Marland Kitchen atorvastatin (LIPITOR) 80 MG tablet Take 1 tablet (80 mg total) by mouth daily at 6 PM. 30 tablet 11  . citalopram (CELEXA) 40 MG tablet Take 40 mg by mouth daily.    . famotidine (PEPCID) 20 MG tablet Take 20 mg by mouth daily as needed for heartburn or indigestion.     . isosorbide mononitrate (IMDUR) 30 MG 24 hr tablet Take 1 tablet (30 mg total) by mouth daily. 30 tablet 11  . levothyroxine (SYNTHROID, LEVOTHROID) 50 MCG tablet Take 50 mcg by mouth daily.    Marland Kitchen lisinopril (PRINIVIL,ZESTRIL) 2.5 MG tablet Take 1 tablet (2.5 mg total) by mouth daily. 30 tablet 11  . metoprolol tartrate (LOPRESSOR) 25 MG tablet Take 1 tablet (25 mg total) by mouth 2 (two) times daily. 60 tablet 11  . nitroGLYCERIN (NITROSTAT) 0.4 MG SL tablet Place 1 tablet (0.4 mg total) under the tongue every 5 (five) minutes x 3 doses as needed for chest pain. 25 tablet 12   No current facility-administered medications for this visit.     Past Medical History  Diagnosis Date  . Hypertension   . GERD (gastroesophageal reflux disease)     Past Surgical History  Procedure Laterality Date  . Cardiac catheterization N/A 01/09/2015    Procedure: Left Heart Cath and Coronary Angiography;  Surgeon: Peter M Martinique, MD;  Location: [facility];  Service: Cardiovascular;  Laterality: N/A;    . Cardiac catheterization N/A 01/12/2015    Procedure: Left Heart Cath and Coronary Angiography;  Surgeon: Sherren Mocha, MD;  Location: [facility];  Service: Cardiovascular;  Laterality: N/A;    History   Social History  . Marital Status: Married    Spouse Name: N/A  . Number of Children: N/A  . Years of Education: N/A   Occupational History  . Not on file.   Social History Main Topics  . Smoking status: Never Smoker   . Smokeless tobacco: Not on file  . Alcohol Use: No  . Drug Use: No  . Sexual Activity: Yes   Other Topics Concern  . Not on file   Social History Narrative    ROS: no fevers or chills, productive cough, hemoptysis, dysphasia, odynophagia, melena, hematochezia, dysuria, hematuria, rash, seizure activity, orthopnea, PND, pedal edema, claudication. Remaining systems are negative.  Physical Exam: Well-developed well-nourished in no acute distress.  Skin is warm and dry.  HEENT is normal.  Neck is supple.  Chest is clear to auscultation with normal expansion.  Cardiovascular exam is regular rate and rhythm.  Abdominal exam nontender or distended. No masses palpated. Extremities show no edema. neuro grossly intact

## 2015-02-05 ENCOUNTER — Ambulatory Visit (INDEPENDENT_AMBULATORY_CARE_PROVIDER_SITE_OTHER): Payer: 59 | Admitting: Cardiology

## 2015-02-05 ENCOUNTER — Encounter: Payer: Self-pay | Admitting: Cardiology

## 2015-02-05 VITALS — BP 138/76 | HR 68 | Ht 61.0 in | Wt 178.6 lb

## 2015-02-05 DIAGNOSIS — E785 Hyperlipidemia, unspecified: Secondary | ICD-10-CM

## 2015-02-05 DIAGNOSIS — I255 Ischemic cardiomyopathy: Secondary | ICD-10-CM | POA: Insufficient documentation

## 2015-02-05 DIAGNOSIS — I10 Essential (primary) hypertension: Secondary | ICD-10-CM | POA: Diagnosis not present

## 2015-02-05 MED ORDER — LISINOPRIL 10 MG PO TABS: 10.0000 mg | ORAL_TABLET | Freq: Every day | ORAL | Status: DC

## 2015-02-05 NOTE — Assessment & Plan Note (Signed)
Patient has had no symptoms. Follow-up catheterization after her initial event showed the dissection was healing. Continue aspirin and statin. Continue nitrates and beta blocker.

## 2015-02-05 NOTE — Assessment & Plan Note (Signed)
Continue statin. Check lipids and liver. 

## 2015-02-05 NOTE — Patient Instructions (Signed)
Your physician recommends that you schedule a follow-up appointment in: [place] LISINOPRIL TO 10 MG ONCE DAILY= 4 OF THE 2.5 MG TABLETS ONCE DAILY  Your physician recommends that you return for lab work in: ONE WEEK= DO NOT EAT PRIOR TO LAB WORK

## 2015-02-05 NOTE — Assessment & Plan Note (Signed)
Catheterization showed mild to moderate reduction in LV function. Echocardiogram showed normal LV function. Continue ACE inhibitor and beta blocker.

## 2015-02-05 NOTE — Assessment & Plan Note (Signed)
Blood pressure mildly elevated. Increase lisinopril to 10 mg daily and follow at home. Check potassium and renal function in 1 week.

## 2015-02-22 ENCOUNTER — Encounter (HOSPITAL_COMMUNITY)
Admission: RE | Admit: 2015-02-22 | Discharge: 2015-02-22 | Disposition: A | Discharge status: Home / Self Care | Payer: 59 | Source: Ambulatory Visit | Attending: Cardiology | Admitting: Cardiology

## 2015-02-22 DIAGNOSIS — I252 Old myocardial infarction: Secondary | ICD-10-CM | POA: Insufficient documentation

## 2015-02-22 NOTE — Progress Notes (Signed)
Cardiac Rehab Medication Review by a Pharmacist  Does the patient  feel that his/her medications are working for him/her?  yes  Has the patient been experiencing any side effects to the medications prescribed?  no  Does the patient measure his/her own blood pressure or blood glucose at home?  yes   Does the patient have any problems obtaining medications due to transportation or finances?   no  Understanding of regimen: good Understanding of indications: good Potential of compliance: good    Pharmacist comments: Pt asked about take tumeric OTC spice that she was taking before her heart attack. I ran DDI and found no interaction with her medications, but instructed her to speak with MD before starting any new meds.  Darl Pikes, PharmD Clinical Pharmacist- Resident Pager: [phone]     Darl Pikes 02/22/2015 8:40 AM

## 2015-02-23 ENCOUNTER — Other Ambulatory Visit: Payer: Self-pay | Admitting: *Deleted

## 2015-02-23 DIAGNOSIS — R7989 Other specified abnormal findings of blood chemistry: Secondary | ICD-10-CM

## 2015-02-23 DIAGNOSIS — R945 Abnormal results of liver function studies: Principal | ICD-10-CM

## 2015-02-23 LAB — BASIC METABOLIC PANEL WITH GFR
BUN: 18 mg/dL (ref 7–25)
CALCIUM: 9 mg/dL (ref 8.6–10.2)
CO2: 26 mmol/L (ref 20–31)
Chloride: 104 mmol/L (ref 98–110)
Creat: 0.76 mg/dL (ref 0.50–1.10)
GFR, Est African American: >89 mL/min (ref >=60)
GFR, Est Non African American: >89 mL/min (ref >=60)
Glucose, Bld: 93 mg/dL (ref 65–99)
Potassium: 4.4 mmol/L (ref 3.5–5.3)
SODIUM: 140 mmol/L (ref 135–146)

## 2015-02-23 LAB — HEPATIC FUNCTION PANEL
ALK PHOS: 91 U/L (ref 33–115)
ALT: 36 U/L — AB (ref 6–29)
AST: 30 U/L (ref 10–35)
Albumin: 4 g/dL (ref 3.6–5.1)
Bilirubin, Direct: 0.1 mg/dL (ref <=0.2)
Indirect Bilirubin: 0.4 mg/dL (ref 0.2–1.2)
TOTAL PROTEIN: 7.2 g/dL (ref 6.1–8.1)
Total Bilirubin: 0.5 mg/dL (ref 0.2–1.2)

## 2015-02-23 LAB — LIPID PANEL
CHOL/HDL RATIO: 3.5 ratio (ref <=5.0)
Cholesterol: 151 mg/dL (ref 125–200)
HDL: 43 mg/dL — AB (ref >=46)
LDL Cholesterol: 90 mg/dL (ref <130)
TRIGLYCERIDES: 92 mg/dL (ref <150)
VLDL: 18 mg/dL (ref <30)

## 2015-02-26 ENCOUNTER — Encounter (HOSPITAL_COMMUNITY)
Admission: RE | Admit: 2015-02-26 | Discharge: 2015-02-26 | Disposition: A | Discharge status: Home / Self Care | Payer: 59 | Source: Ambulatory Visit | Attending: Cardiology | Admitting: Cardiology

## 2015-02-26 DIAGNOSIS — I252 Old myocardial infarction: Secondary | ICD-10-CM | POA: Diagnosis not present

## 2015-02-26 NOTE — Progress Notes (Signed)
Pt started cardiac rehab today.  Pt tolerated light exercise without difficulty., telemetry-sinus rhtyhm, asymptomatic.  Medication list reconciled.  Pt verbalized compliance with medications and denies barriers to compliance. PSYCHOSOCIAL ASSESSMENT:  PHQ-0. Pt exhibits positive coping skills, hopeful outlook with supportive family. No psychosocial needs identified at this time, no psychosocial interventions necessary.    Pt enjoys shopping and watching TV.   Pt cardiac rehab  goal is  to loose weight.  Pt encouraged to participate in exercising on your own and the dietary classes offered to increase ability to achieve these goals.   Pt long term cardiac rehab goal is to loose 30 lbs..  Pt oriented to exercise equipment and routine.  Understanding verbalized. Aidee's resting blood pressure was 108/60. Taniya was walk tested this after noon and walked 2275 feet. Matti's blood pressure was noted at 186/96 her recheck blood pressure was 118/84. Dominika was walking at a rapid pace during the walk test and told me she has not been exercising on a regular basis since her hospitalization. Subsequent blood pressure were within normal limits. Dr Jacalyn Lefevre office called and notified. Exit blood pressure 118/60. Will fax exercise flow sheets to Dr. Jacalyn Lefevre office for review. Will continue to monitor the patient throughout  the program.

## 2015-02-27 ENCOUNTER — Telehealth (HOSPITAL_COMMUNITY): Payer: Self-pay | Admitting: *Deleted

## 2015-02-27 NOTE — Telephone Encounter (Signed)
-----   Message from Cristopher Estimable, RN sent at 02/27/2015  7:30 AM EDT ----- Regarding: FW: exertional blood pressure elevation at cardiac rehab   ----- Message -----    From: Lelon Perla, MD    Sent: 02/26/2015   8:00 PM      To: Cristopher Estimable, RN Subject: RE: exertional blood pressure elevation at c#  Continue present meds Kirk Ruths  ----- Message -----    From: Magda Kiel, RN    Sent: 02/26/2015   5:05 PM      To: Cristopher Estimable, RN, Lelon Perla, MD Subject: exertional blood pressure elevation at cardi#  Good afternoon Dr Stanford Breed  Today was Ms Nakagawa first day of exercise at cardiac rehab. Ms Creek entry blood pressure was 108/60. Ms Agramonte was walk tested today she walked 2275 feet at a very rapid pace in 6 minutes her blood pressure went up to 186/96. MS Mulvaney recheck blood pressure was 118/84 then 118/70. Ms Kroenke took hers medications as prescribed. Ms Slovacek exit blood pressure was 118/60. I want to bring this to your attention. I see you recently increased Ms Deloria lisinopril at her office visit on 02/05/2015.  I have faxed today's vital signs to your office for review.  Thanks for your input!  Ms Breit did well and had no complaints with exercise today. Ms Litaker told me she has not been exercising at home because she was apprehensive.  Verdis Frederickson

## 2015-02-28 ENCOUNTER — Encounter (HOSPITAL_COMMUNITY)
Admission: RE | Admit: 2015-02-28 | Discharge: 2015-02-28 | Disposition: A | Discharge status: Home / Self Care | Payer: 59 | Source: Ambulatory Visit | Attending: Cardiology | Admitting: Cardiology

## 2015-02-28 DIAGNOSIS — I252 Old myocardial infarction: Secondary | ICD-10-CM | POA: Diagnosis not present

## 2015-03-02 ENCOUNTER — Encounter (HOSPITAL_COMMUNITY)
Admission: RE | Admit: 2015-03-02 | Discharge: 2015-03-02 | Disposition: A | Discharge status: Home / Self Care | Payer: 59 | Source: Ambulatory Visit | Attending: Cardiology | Admitting: Cardiology

## 2015-03-02 DIAGNOSIS — I252 Old myocardial infarction: Secondary | ICD-10-CM | POA: Diagnosis not present

## 2015-03-05 ENCOUNTER — Encounter (HOSPITAL_COMMUNITY)
Admission: RE | Admit: 2015-03-05 | Discharge: 2015-03-05 | Disposition: A | Discharge status: Home / Self Care | Payer: 59 | Source: Ambulatory Visit | Attending: Cardiology | Admitting: Cardiology

## 2015-03-05 DIAGNOSIS — I252 Old myocardial infarction: Secondary | ICD-10-CM | POA: Diagnosis not present

## 2015-03-07 ENCOUNTER — Encounter (HOSPITAL_COMMUNITY)
Admission: RE | Admit: 2015-03-07 | Discharge: 2015-03-07 | Disposition: A | Discharge status: Home / Self Care | Payer: 59 | Source: Ambulatory Visit | Attending: Cardiology | Admitting: Cardiology

## 2015-03-07 DIAGNOSIS — I252 Old myocardial infarction: Secondary | ICD-10-CM | POA: Diagnosis not present

## 2015-03-07 NOTE — Progress Notes (Signed)
Reviewed home exercise guidelines with patient including endpoints, temperature precautions, target heart rate and rate of perceived exertion. Pt plans to walk and has access to fitness center at work as her mode of home exercise. Pt voices understanding of instructions given. Sol Passer, MS, ACSM CCEP

## 2015-03-09 ENCOUNTER — Encounter (HOSPITAL_COMMUNITY): Payer: 59

## 2015-03-14 ENCOUNTER — Encounter (HOSPITAL_COMMUNITY): Payer: 59

## 2015-03-16 ENCOUNTER — Encounter (HOSPITAL_COMMUNITY)
Admission: RE | Admit: 2015-03-16 | Discharge: 2015-03-16 | Disposition: A | Discharge status: Home / Self Care | Payer: 59 | Source: Ambulatory Visit | Attending: Cardiology | Admitting: Cardiology

## 2015-03-16 DIAGNOSIS — I252 Old myocardial infarction: Secondary | ICD-10-CM | POA: Diagnosis not present

## 2015-03-19 ENCOUNTER — Encounter (HOSPITAL_COMMUNITY): Payer: 59

## 2015-03-21 ENCOUNTER — Encounter (HOSPITAL_COMMUNITY)
Admission: RE | Admit: 2015-03-21 | Discharge: 2015-03-21 | Disposition: A | Discharge status: Home / Self Care | Payer: 59 | Source: Ambulatory Visit | Attending: Cardiology | Admitting: Cardiology

## 2015-03-21 DIAGNOSIS — I252 Old myocardial infarction: Secondary | ICD-10-CM | POA: Diagnosis not present

## 2015-03-23 ENCOUNTER — Encounter (HOSPITAL_COMMUNITY)
Admission: RE | Admit: 2015-03-23 | Discharge: 2015-03-23 | Disposition: A | Discharge status: Home / Self Care | Payer: 59 | Source: Ambulatory Visit | Attending: Cardiology | Admitting: Cardiology

## 2015-03-23 DIAGNOSIS — I252 Old myocardial infarction: Secondary | ICD-10-CM | POA: Diagnosis not present

## 2015-03-23 NOTE — Progress Notes (Signed)
QUALITY OF LIFE SCORE REVIEW Patient score low in area of socioeconomic  with a score of 18.50  .  Scores less than 21 are considered low.  Patient quality of life slightly altered by physical constraints which limits ability to perform as prior to recent cardiac illness.  Samantha Lester says she will be moving in a few months to a different neighborhood and a nicer home . Samantha Lester also reports that she sometimes has stress at work. Samantha Lester works in the [employer] here at [employer]. Offered emotional support and reassurance.  Will continue to monitor and intervene as necessary.  Samantha Lester denies being depressed. Samantha Lester has been doing well with exercise but has not been able to attend any of the educational classes due to her work schedule.

## 2015-03-26 ENCOUNTER — Encounter (HOSPITAL_COMMUNITY)
Admission: RE | Admit: 2015-03-26 | Discharge: 2015-03-26 | Disposition: A | Discharge status: Home / Self Care | Payer: 59 | Source: Ambulatory Visit | Attending: Cardiology | Admitting: Cardiology

## 2015-03-26 ENCOUNTER — Telehealth: Payer: Self-pay | Admitting: *Deleted

## 2015-03-26 DIAGNOSIS — I252 Old myocardial infarction: Secondary | ICD-10-CM | POA: Diagnosis not present

## 2015-03-26 NOTE — Progress Notes (Signed)
Patient reports that she had some mild chest discomfort between 12:30 and 1;00pm for an a few minutes while at work in the [employer] today. Samantha Lester says she is not sure how long it lasted but it went away she became busy and the pain went away. Patient reported having no chest pain or discomfort today during exercise at cardiac rehab this afternoon. [name] RN at Dr Jacalyn Lefevre office called and notified. Will fax exercise flow sheets to Dr. Jacalyn Lefevre office for review with today's ECG tracings.

## 2015-03-26 NOTE — Telephone Encounter (Signed)
Please check with patient concerning symptoms; if having CP, will need fuov Kirk Ruths

## 2015-03-26 NOTE — Telephone Encounter (Signed)
Verdis Frederickson called from cardiac rehab with update on patient.   Patient had NSTEMI July 4 and works in [place] at [employer]. She has had some chest discomfort 1.5-2 of 10 on pain scale. She has NO chest pain during rehab. EKG tracings normal but no EKG performed. She will fax info.   Message routed to MD as Juluis Rainier

## 2015-03-27 NOTE — Telephone Encounter (Signed)
Left message for pt to call.

## 2015-03-28 ENCOUNTER — Encounter (HOSPITAL_COMMUNITY): Payer: 59

## 2015-03-30 ENCOUNTER — Encounter (HOSPITAL_COMMUNITY)
Admission: RE | Admit: 2015-03-30 | Discharge: 2015-03-30 | Disposition: A | Discharge status: Home / Self Care | Payer: 59 | Source: Ambulatory Visit | Attending: Cardiology | Admitting: Cardiology

## 2015-03-30 DIAGNOSIS — I252 Old myocardial infarction: Secondary | ICD-10-CM | POA: Diagnosis not present

## 2015-04-02 ENCOUNTER — Encounter (HOSPITAL_COMMUNITY): Payer: 59

## 2015-04-04 ENCOUNTER — Encounter (HOSPITAL_COMMUNITY): Payer: 59

## 2015-04-04 NOTE — Progress Notes (Signed)
HPI: FU CAD. Admitted 7/16 with NSTEMI. Echo 7/16 showed normal LV function and mild MR. 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 EF 40-45. CVTS saw patient but felt CABG not indicated. Patient treated medically. FU cath 01/12/15 showed improvement of dissection with residual 75 OM1 and 80 distal Lcx. Since last seen, the patient denies dyspnea, exertional chest pain or syncope. She occasionally feels a brief "uncomfortable or flutter" in her chest. Her symptoms are unlike her previous infarct pain.  Current Outpatient Prescriptions  Medication Sig Dispense Refill  . aspirin EC 81 MG EC tablet Take 1 tablet (81 mg total) by mouth daily.    Marland Kitchen atorvastatin (LIPITOR) 80 MG tablet Take 1 tablet (80 mg total) by mouth daily at 6 PM. 30 tablet 11  . citalopram (CELEXA) 40 MG tablet Take 40 mg by mouth daily.    . famotidine (PEPCID) 20 MG tablet Take 20 mg by mouth daily as needed for heartburn or indigestion.     . isosorbide mononitrate (IMDUR) 30 MG 24 hr tablet Take 1 tablet (30 mg total) by mouth daily. 30 tablet 11  . levothyroxine (SYNTHROID, LEVOTHROID) 50 MCG tablet Take 50 mcg by mouth daily.    Marland Kitchen lisinopril (PRINIVIL,ZESTRIL) 10 MG tablet Take 1 tablet (10 mg total) by mouth daily. 90 tablet 3  . metoprolol tartrate (LOPRESSOR) 25 MG tablet Take 1 tablet (25 mg total) by mouth 2 (two) times daily. 60 tablet 11  . nitroGLYCERIN (NITROSTAT) 0.4 MG SL tablet Place 1 tablet (0.4 mg total) under the tongue every 5 (five) minutes x 3 doses as needed for chest pain. 25 tablet 12   No current facility-administered medications for this visit.     Past Medical History  Diagnosis Date  . Hypertension   . GERD (gastroesophageal reflux disease)   . Hyperlipidemia   . Coronary artery dissection     Past Surgical History  Procedure Laterality Date  . Cardiac catheterization N/A 01/09/2015    Procedure: Left Heart Cath and  Coronary Angiography;  Surgeon: Peter M Martinique, MD;  Location: [place] [facility];  Service: Cardiovascular;  Laterality: N/A;  . Cardiac catheterization N/A 01/12/2015    Procedure: Left Heart Cath and Coronary Angiography;  Surgeon: Sherren Mocha, MD;  Location: [facility] [facility];  Service: Cardiovascular;  Laterality: N/A;    Social History   Social History  . Marital Status: Married    Spouse Name: N/A  . Number of Children: N/A  . Years of Education: N/A   Occupational History  . Not on file.   Social History Main Topics  . Smoking status: Never Smoker   . Smokeless tobacco: Not on file  . Alcohol Use: No  . Drug Use: No  . Sexual Activity: Yes   Other Topics Concern  . Not on file   Social History Narrative    ROS: no fevers or chills, productive cough, hemoptysis, dysphasia, odynophagia, melena, hematochezia, dysuria, hematuria, rash, seizure activity, orthopnea, PND, pedal edema, claudication. Remaining systems are negative.  Physical Exam: Well-developed obese in no acute distress.  Skin is warm and dry.  HEENT is normal.  Neck is supple.  Chest is clear to auscultation with normal expansion.  Cardiovascular exam is regular rate and rhythm.  Abdominal exam nontender or distended. No masses palpated. Extremities show no edema. neuro grossly intact  ECG sinus bradycardia at a rate of 55. No  ST changes.

## 2015-04-06 ENCOUNTER — Encounter (HOSPITAL_COMMUNITY): Payer: 59

## 2015-04-06 ENCOUNTER — Encounter: Payer: Self-pay | Admitting: Cardiology

## 2015-04-06 ENCOUNTER — Ambulatory Visit (INDEPENDENT_AMBULATORY_CARE_PROVIDER_SITE_OTHER): Payer: 59 | Admitting: Cardiology

## 2015-04-06 VITALS — BP 124/84 | HR 60 | Ht 61.0 in | Wt 184.8 lb

## 2015-04-06 DIAGNOSIS — I255 Ischemic cardiomyopathy: Secondary | ICD-10-CM | POA: Diagnosis not present

## 2015-04-06 DIAGNOSIS — E785 Hyperlipidemia, unspecified: Secondary | ICD-10-CM | POA: Diagnosis not present

## 2015-04-06 DIAGNOSIS — I2542 Coronary artery dissection: Secondary | ICD-10-CM

## 2015-04-06 MED ORDER — ISOSORBIDE MONONITRATE ER 60 MG PO TB24: 60.0000 mg | ORAL_TABLET | Freq: Every day | ORAL | Status: DC

## 2015-04-06 NOTE — Patient Instructions (Signed)
Your physician recommends that you schedule a follow-up appointment in: 3 MONTHS WITH DR CRENSHAW  INCREASE ISOSORBIDE TO 60 MG DAILY= 2 OF THE 30 MG TABLETS ONCE DAILY

## 2015-04-06 NOTE — Assessment & Plan Note (Signed)
Patient has had a brief flutter with no recurrent chest pain. She will contact us if she has any symptoms similar to her previous infarct pain. Continue aspirin and statin. Continue beta blocker. Increase isosorbide to 60 mg daily.

## 2015-04-06 NOTE — Assessment & Plan Note (Signed)
Continue statin. 

## 2015-04-06 NOTE — Assessment & Plan Note (Signed)
Blood pressure controlled. Continue present medications. 

## 2015-04-06 NOTE — Assessment & Plan Note (Signed)
Catheterization showed mild to moderate reduction in LV function. Echocardiogram showed normal LV function. Continue ACE inhibitor and beta blocker. 

## 2015-04-09 ENCOUNTER — Encounter (HOSPITAL_COMMUNITY)
Admission: RE | Admit: 2015-04-09 | Discharge: 2015-04-09 | Disposition: A | Discharge status: Home / Self Care | Payer: 59 | Source: Ambulatory Visit | Attending: Cardiology | Admitting: Cardiology

## 2015-04-09 DIAGNOSIS — I252 Old myocardial infarction: Secondary | ICD-10-CM | POA: Insufficient documentation

## 2015-04-09 NOTE — Progress Notes (Signed)
Samantha Lester 49 y.o. female Nutrition Note Spoke with pt.  Nutrition Survey reviewed with pt. Pt is following Step 1 of the Therapeutic Lifestyle Changes diet. Pt c/o wt gain and wants to lose wt. Pt wt stable in rehab. Wt loss tips reviewed. Pt is pre-diabetic. Per discussion, pt was aware of elevated A1c. Pre-diabetes discussed. Pt expressed understanding of the information reviewed. Pt aware of nutrition education classes offered. Lab Results  Component Value Date   HGBA1C 6.3* 01/09/2015   Wt Readings from Last 3 Encounters:  04/06/15 184 lb 12.8 oz (83.825 kg)  02/22/15 179 lb 14.3 oz (81.6 kg)  02/05/15 178 lb 9.6 oz (81.012 kg)   Nutrition Diagnosis ? Food-and nutrition-related knowledge deficit related to lack of exposure to information as related to diagnosis of: ? CVD ? Pre-DM ? Obesity related to excessive energy intake as evidenced by a BMI of 34.0  Nutrition Intervention ? Benefits of adopting Therapeutic Lifestyle Changes discussed when Medficts reviewed. ? Pt to attend the Portion Distortion class ? Pt to attend the  ? Nutrition I class                      ? Nutrition II class ? Pt given handouts for: ? Nutrition I class ? Nutrition II class ? Continue client-centered nutrition education by RD, as part of interdisciplinary care.  Goal(s) ? Pt to choose more whole grain breads. ? Pt to use lower fat milk and yogurt. ? Pt to go easy on high fat cheeses. ? Pt to watch out for sweets and added sugar. ? Pt to identify food quantities necessary to achieve: ? wt loss to a goal wt of 156-174 lb (70.7-78.9 kg) at graduation from cardiac rehab.   Monitor and Evaluate progress toward nutrition goal with team.  Derek Mound, M.Ed, RD, LDN, CDE 04/09/2015 3:50 PM

## 2015-04-11 ENCOUNTER — Encounter (HOSPITAL_COMMUNITY): Payer: 59

## 2015-04-13 ENCOUNTER — Encounter (HOSPITAL_COMMUNITY)
Admission: RE | Admit: 2015-04-13 | Discharge: 2015-04-13 | Disposition: A | Discharge status: Home / Self Care | Payer: 59 | Source: Ambulatory Visit | Attending: Cardiology | Admitting: Cardiology

## 2015-04-13 DIAGNOSIS — I252 Old myocardial infarction: Secondary | ICD-10-CM | POA: Diagnosis not present

## 2015-04-16 ENCOUNTER — Encounter (HOSPITAL_COMMUNITY): Payer: 59

## 2015-04-18 ENCOUNTER — Encounter (HOSPITAL_COMMUNITY): Payer: 59

## 2015-04-20 ENCOUNTER — Encounter (HOSPITAL_COMMUNITY): Payer: 59

## 2015-04-23 ENCOUNTER — Encounter (HOSPITAL_COMMUNITY)
Admission: RE | Admit: 2015-04-23 | Discharge: 2015-04-23 | Disposition: A | Discharge status: Home / Self Care | Payer: 59 | Source: Ambulatory Visit | Attending: Cardiology | Admitting: Cardiology

## 2015-04-23 DIAGNOSIS — I252 Old myocardial infarction: Secondary | ICD-10-CM | POA: Diagnosis not present

## 2015-04-25 ENCOUNTER — Encounter (HOSPITAL_COMMUNITY)
Admission: RE | Admit: 2015-04-25 | Discharge: 2015-04-25 | Disposition: A | Discharge status: Home / Self Care | Payer: 59 | Source: Ambulatory Visit | Attending: Cardiology | Admitting: Cardiology

## 2015-04-25 DIAGNOSIS — I252 Old myocardial infarction: Secondary | ICD-10-CM | POA: Diagnosis not present

## 2015-04-27 ENCOUNTER — Encounter (HOSPITAL_COMMUNITY): Payer: 59

## 2015-04-30 ENCOUNTER — Encounter (HOSPITAL_COMMUNITY): Payer: 59

## 2015-05-02 ENCOUNTER — Encounter (HOSPITAL_COMMUNITY): Payer: 59

## 2015-05-04 ENCOUNTER — Telehealth: Payer: Self-pay | Admitting: Cardiology

## 2015-05-04 ENCOUNTER — Encounter (HOSPITAL_COMMUNITY)
Admission: RE | Admit: 2015-05-04 | Discharge: 2015-05-04 | Disposition: A | Discharge status: Home / Self Care | Payer: 59 | Source: Ambulatory Visit | Attending: Cardiology | Admitting: Cardiology

## 2015-05-04 DIAGNOSIS — I252 Old myocardial infarction: Secondary | ICD-10-CM | POA: Diagnosis not present

## 2015-05-04 MED ORDER — LEVOTHYROXINE SODIUM 50 MCG PO TABS: 50.0000 ug | ORAL_TABLET | Freq: Every day | ORAL | Status: AC

## 2015-05-04 NOTE — Telephone Encounter (Signed)
Received a call from [employer] [facility] with Cardiac Rehab.Stated patient needs a refill on synthroid until she gets a new PCP.Refill sent to pharmacy.

## 2015-05-04 NOTE — Progress Notes (Signed)
Samantha Lester says she has not taken her thyroid medication in the past 3 weeks. Samantha Lester's primary care previous primary care physician was in [place]. Samantha Lester has been having difficulty getting an appointment with a primary care physician here in [place]. Samantha Lester says she plans to go to the urgent care on [address] next week. Dr Jacalyn Lefevre office called. A 30 day prescription will be called in for Samantha Lester's thyroid medication. Samantha Lester's blood pressure was 180/80 on the nustep today. Samantha Lester says she has felt out of order since she has not been taking her thyroid medication. Subsequent blood pressures improved. Exit blood pressure 118/78. Will continue to monitor the patient throughout  the program.

## 2015-05-04 NOTE — Telephone Encounter (Signed)
Samantha Lester is calling

## 2015-05-07 ENCOUNTER — Encounter (HOSPITAL_COMMUNITY): Payer: 59

## 2015-05-09 ENCOUNTER — Encounter (HOSPITAL_COMMUNITY): Payer: 59

## 2015-05-11 ENCOUNTER — Encounter (HOSPITAL_COMMUNITY): Payer: 59

## 2015-05-14 ENCOUNTER — Encounter (HOSPITAL_COMMUNITY)
Admission: RE | Admit: 2015-05-14 | Discharge: 2015-05-14 | Disposition: A | Discharge status: Home / Self Care | Payer: 59 | Source: Ambulatory Visit | Attending: Cardiology | Admitting: Cardiology

## 2015-05-14 DIAGNOSIS — I252 Old myocardial infarction: Secondary | ICD-10-CM | POA: Diagnosis not present

## 2015-05-16 ENCOUNTER — Encounter (HOSPITAL_COMMUNITY): Payer: 59

## 2015-05-18 ENCOUNTER — Telehealth: Payer: Self-pay | Admitting: *Deleted

## 2015-05-18 ENCOUNTER — Encounter (HOSPITAL_COMMUNITY): Payer: 59

## 2015-05-18 DIAGNOSIS — R945 Abnormal results of liver function studies: Principal | ICD-10-CM

## 2015-05-18 DIAGNOSIS — R7989 Other specified abnormal findings of blood chemistry: Secondary | ICD-10-CM

## 2015-05-18 NOTE — Telephone Encounter (Signed)
Left message for pt to call, reminder she needs to have liver function rechecked.

## 2015-05-21 ENCOUNTER — Encounter (HOSPITAL_COMMUNITY)
Admission: RE | Admit: 2015-05-21 | Discharge: 2015-05-21 | Disposition: A | Discharge status: Home / Self Care | Payer: 59 | Source: Ambulatory Visit | Attending: Cardiology | Admitting: Cardiology

## 2015-05-21 DIAGNOSIS — I252 Old myocardial infarction: Secondary | ICD-10-CM | POA: Diagnosis not present

## 2015-05-21 NOTE — Telephone Encounter (Signed)
Pt is returning your call

## 2015-05-21 NOTE — Telephone Encounter (Signed)
Spoke with pt, she will contact the [address] office to schedule appt for lab work

## 2015-05-23 ENCOUNTER — Encounter (HOSPITAL_COMMUNITY): Payer: 59

## 2015-05-25 ENCOUNTER — Encounter (HOSPITAL_COMMUNITY): Payer: 59

## 2015-05-28 ENCOUNTER — Encounter (HOSPITAL_COMMUNITY): Payer: 59

## 2015-05-30 ENCOUNTER — Encounter (HOSPITAL_COMMUNITY): Payer: 59

## 2015-06-01 ENCOUNTER — Encounter (HOSPITAL_COMMUNITY): Payer: 59

## 2015-06-15 LAB — HEPATIC FUNCTION PANEL
ALK PHOS: 93 U/L (ref 33–115)
ALT: 91 U/L — AB (ref 6–29)
AST: 50 U/L — AB (ref 10–35)
Albumin: 3.8 g/dL (ref 3.6–5.1)
Total Bilirubin: 0.3 mg/dL (ref 0.2–1.2)
Total Protein: 6.9 g/dL (ref 6.1–8.1)

## 2015-06-18 ENCOUNTER — Telehealth: Payer: Self-pay | Admitting: *Deleted

## 2015-06-18 DIAGNOSIS — R740 Nonspecific elevation of levels of transaminase and lactic acid dehydrogenase [LDH]: Secondary | ICD-10-CM

## 2015-06-18 DIAGNOSIS — R7401 Elevation of levels of liver transaminase levels: Secondary | ICD-10-CM

## 2015-06-18 DIAGNOSIS — E785 Hyperlipidemia, unspecified: Secondary | ICD-10-CM

## 2015-06-18 NOTE — Telephone Encounter (Signed)
-----   Message from Lelon Perla, MD sent at 06/15/2015  5:24 AM EST ----- Change lipitor to 20 mg daily, lfts 6 weeks Kirk Ruths

## 2015-06-18 NOTE — Telephone Encounter (Signed)
Left message for pt to call.

## 2015-06-19 MED ORDER — ATORVASTATIN CALCIUM 20 MG PO TABS: 20.0000 mg | ORAL_TABLET | Freq: Every day | ORAL | Status: DC

## 2015-06-19 NOTE — Telephone Encounter (Signed)
Returning call from yesterday. She said to leave a  message if she does not answer.

## 2015-06-19 NOTE — Telephone Encounter (Signed)
Patient called lab results given.Dr.Crenshaw advised to decrease lipitor to 20 mg daily.Repeat liver panel in 6 weeks.Prescription sent to pharmacy.Lab orders mailed to patient.

## 2015-07-11 MED FILL — METOPROLOL TARTRATE 25 MG T: 25 | 90 days supply | Qty: 180 | Fill #2

## 2015-07-11 NOTE — Progress Notes (Signed)
      HPI: FU CAD. Admitted 7/16 with NSTEMI. Echo 7/16 showed normal LV function and mild MR. 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 EF 40-45. CVTS saw patient but felt CABG not indicated. Patient treated medically. FU cath 01/12/15 showed improvement of dissection with residual 75 OM1 and 80 distal Lcx. Since last seen, Patient denies dyspnea, chest pain, palpitations or syncope. She has some muscle aches and also describes fatigue.  Current Outpatient Prescriptions  Medication Sig Dispense Refill  . aspirin EC 81 MG EC tablet Take 1 tablet (81 mg total) by mouth daily.    Marland Kitchen atorvastatin (LIPITOR) 20 MG tablet Take 1 tablet (20 mg total) by mouth daily. 30 tablet 6  . citalopram (CELEXA) 40 MG tablet Take 40 mg by mouth daily.    . famotidine (PEPCID) 20 MG tablet Take 20 mg by mouth daily as needed for heartburn or indigestion.     . isosorbide mononitrate (IMDUR) 60 MG 24 hr tablet Take 1 tablet (60 mg total) by mouth daily. 90 tablet 3  . levothyroxine (SYNTHROID, LEVOTHROID) 50 MCG tablet Take 1 tablet (50 mcg total) by mouth daily. 30 tablet 0  . lisinopril (PRINIVIL,ZESTRIL) 10 MG tablet Take 1 tablet (10 mg total) by mouth daily. 90 tablet 3  . metoprolol tartrate (LOPRESSOR) 25 MG tablet Take 1 tablet (25 mg total) by mouth 2 (two) times daily. 60 tablet 11  . nitroGLYCERIN (NITROSTAT) 0.4 MG SL tablet Place 1 tablet (0.4 mg total) under the tongue every 5 (five) minutes x 3 doses as needed for chest pain. 25 tablet 12   No current facility-administered medications for this visit.     Past Medical History  Diagnosis Date  . Hypertension   . GERD (gastroesophageal reflux disease)   . Hyperlipidemia   . Coronary artery dissection     Past Surgical History  Procedure Laterality Date  . Cardiac catheterization N/A 01/09/2015    Procedure: Left Heart Cath and Coronary Angiography;  Surgeon: Peter M Martinique, MD;   Location: [facility];  Service: Cardiovascular;  Laterality: N/A;  . Cardiac catheterization N/A 01/12/2015    Procedure: Left Heart Cath and Coronary Angiography;  Surgeon: Sherren Mocha, MD;  Location: [facility];  Service: Cardiovascular;  Laterality: N/A;    Social History   Social History  . Marital Status: Married    Spouse Name: N/A  . Number of Children: N/A  . Years of Education: N/A   Occupational History  . Not on file.   Social History Main Topics  . Smoking status: Never Smoker   . Smokeless tobacco: Not on file  . Alcohol Use: No  . Drug Use: No  . Sexual Activity: Yes   Other Topics Concern  . Not on file   Social History Narrative    ROS: no fevers or chills, productive cough, hemoptysis, dysphasia, odynophagia, melena, hematochezia, dysuria, hematuria, rash, seizure activity, orthopnea, PND, pedal edema, claudication. Remaining systems are negative.  Physical Exam: Well-developed obese in no acute distress.  Skin is warm and dry.  HEENT is normal.  Neck is supple.  Chest is clear to auscultation with normal expansion.  Cardiovascular exam is regular rate and rhythm.  Abdominal exam nontender or distended. No masses palpated. Extremities show no edema. neuro grossly intact

## 2015-07-13 ENCOUNTER — Ambulatory Visit (INDEPENDENT_AMBULATORY_CARE_PROVIDER_SITE_OTHER): Payer: 59 | Admitting: Cardiology

## 2015-07-13 ENCOUNTER — Encounter: Payer: Self-pay | Admitting: Cardiology

## 2015-07-13 VITALS — BP 138/98 | HR 68 | Ht 61.0 in | Wt 186.5 lb

## 2015-07-13 DIAGNOSIS — I255 Ischemic cardiomyopathy: Secondary | ICD-10-CM | POA: Diagnosis not present

## 2015-07-13 DIAGNOSIS — I10 Essential (primary) hypertension: Secondary | ICD-10-CM

## 2015-07-13 DIAGNOSIS — E785 Hyperlipidemia, unspecified: Secondary | ICD-10-CM

## 2015-07-13 MED ORDER — LISINOPRIL 20 MG PO TABS: 20.0000 mg | ORAL_TABLET | Freq: Every day | ORAL | Status: DC

## 2015-07-13 MED FILL — LISINOPRIL 20 MG TABLET: 20 | 90 days supply | Qty: 90 | Fill #0

## 2015-07-13 NOTE — Assessment & Plan Note (Signed)
Blood pressure elevated.She is complaining of fatigue and I wonder if this is secondary to beta-blockade. Discontinue metoprolol. Increase lisinopril to 20 mg daily. Follow blood pressure and adjust regimen as needed. Check potassium and renal function in 1 week.

## 2015-07-13 NOTE — Assessment & Plan Note (Signed)
Continue aspirin. No recent symptoms.

## 2015-07-13 NOTE — Assessment & Plan Note (Signed)
Discontinue beta blocker given potential fatigue. LV function minimally reduced. Continue ACE inhibitor.

## 2015-07-13 NOTE — Patient Instructions (Signed)
Medication Instructions:   STOP METOPROLOL  STOP ATORVASTATIN FOR 6 WEEKS=IF NO BETTER=RESTART  IF SYMPTOMS IMPROVE=CALL us AND LET us KNOW  INCREASE LISINOPRIL TO 20 MG ONCE DAILY= 2 OF THE 10 MG TABLETS ONCE DAILY  Labwork:  Your physician recommends that you return for lab work in: [place]:  Your physician wants you to follow-up in: [place] will receive a reminder letter in the mail two months in advance. If you don't receive a letter, please call our office to schedule the follow-up appointment.   If you need a refill on your cardiac medications before your next appointment, please call your pharmacy.

## 2015-07-13 NOTE — Assessment & Plan Note (Signed)
Patient complaining of some aches and pains. We will hold Lipitor for 4-6 weeks to see if symptoms improve. If not resume. If so we will try different statin.

## 2015-08-01 DIAGNOSIS — I10 Essential (primary) hypertension: Secondary | ICD-10-CM | POA: Diagnosis not present

## 2015-08-01 DIAGNOSIS — E038 Other specified hypothyroidism: Secondary | ICD-10-CM | POA: Diagnosis not present

## 2015-08-01 DIAGNOSIS — E7800 Pure hypercholesterolemia, unspecified: Secondary | ICD-10-CM | POA: Diagnosis not present

## 2015-08-01 DIAGNOSIS — Z9861 Coronary angioplasty status: Secondary | ICD-10-CM | POA: Diagnosis not present

## 2015-08-01 DIAGNOSIS — Z1389 Encounter for screening for other disorder: Secondary | ICD-10-CM | POA: Diagnosis not present

## 2015-08-01 DIAGNOSIS — I2510 Atherosclerotic heart disease of native coronary artery without angina pectoris: Secondary | ICD-10-CM | POA: Diagnosis not present

## 2015-08-01 DIAGNOSIS — R5381 Other malaise: Secondary | ICD-10-CM | POA: Diagnosis not present

## 2015-08-01 DIAGNOSIS — Z6836 Body mass index (BMI) 36.0-36.9, adult: Secondary | ICD-10-CM | POA: Diagnosis not present

## 2015-08-28 MED FILL — ISOSORBIDE MN ER 60 MG TAB: 60 | 90 days supply | Qty: 90 | Fill #1

## 2015-09-12 ENCOUNTER — Other Ambulatory Visit: Payer: Self-pay | Admitting: *Deleted

## 2015-09-17 DIAGNOSIS — Z9861 Coronary angioplasty status: Secondary | ICD-10-CM | POA: Diagnosis not present

## 2015-09-17 DIAGNOSIS — R42 Dizziness and giddiness: Secondary | ICD-10-CM | POA: Diagnosis not present

## 2015-09-17 DIAGNOSIS — I10 Essential (primary) hypertension: Secondary | ICD-10-CM | POA: Diagnosis not present

## 2015-09-17 DIAGNOSIS — R55 Syncope and collapse: Secondary | ICD-10-CM | POA: Diagnosis not present

## 2015-09-17 DIAGNOSIS — Z6835 Body mass index (BMI) 35.0-35.9, adult: Secondary | ICD-10-CM | POA: Diagnosis not present

## 2015-09-17 MED FILL — CITALOPRAM HBR 40 MG TABLET: 40 | 30 days supply | Qty: 30 | Fill #0

## 2015-09-17 MED FILL — LEVOTHYROXINE 50 MCG TABLET: 50 | 90 days supply | Qty: 90 | Fill #0

## 2015-09-19 ENCOUNTER — Telehealth: Payer: Self-pay | Admitting: Physician Assistant

## 2015-09-19 NOTE — Telephone Encounter (Signed)
Received records from [facility] for appointment on 10/01/15 with Samantha Fuller, PA.  Records given to [employer] (medical records) for Samantha Lester's schedule on 10/01/15.

## 2015-09-20 DIAGNOSIS — I10 Essential (primary) hypertension: Secondary | ICD-10-CM | POA: Diagnosis not present

## 2015-09-20 DIAGNOSIS — Z6836 Body mass index (BMI) 36.0-36.9, adult: Secondary | ICD-10-CM | POA: Diagnosis not present

## 2015-09-20 DIAGNOSIS — F418 Other specified anxiety disorders: Secondary | ICD-10-CM | POA: Diagnosis not present

## 2015-09-20 DIAGNOSIS — E669 Obesity, unspecified: Secondary | ICD-10-CM | POA: Diagnosis not present

## 2015-10-01 ENCOUNTER — Ambulatory Visit: Payer: 59 | Admitting: Physician Assistant

## 2015-10-29 DIAGNOSIS — M76822 Posterior tibial tendinitis, left leg: Secondary | ICD-10-CM | POA: Diagnosis not present

## 2015-10-29 DIAGNOSIS — M76821 Posterior tibial tendinitis, right leg: Secondary | ICD-10-CM | POA: Diagnosis not present

## 2015-10-29 MED FILL — CITALOPRAM HBR 40 MG TABLET: 40 | 30 days supply | Qty: 30 | Fill #1

## 2015-10-29 MED FILL — LISINOPRIL 20 MG TABLET: 20 | 90 days supply | Qty: 90 | Fill #1

## 2015-10-30 DIAGNOSIS — E038 Other specified hypothyroidism: Secondary | ICD-10-CM | POA: Diagnosis not present

## 2015-10-30 DIAGNOSIS — F418 Other specified anxiety disorders: Secondary | ICD-10-CM | POA: Diagnosis not present

## 2015-10-30 DIAGNOSIS — E7800 Pure hypercholesterolemia, unspecified: Secondary | ICD-10-CM | POA: Diagnosis not present

## 2015-10-30 DIAGNOSIS — I2510 Atherosclerotic heart disease of native coronary artery without angina pectoris: Secondary | ICD-10-CM | POA: Diagnosis not present

## 2015-10-30 DIAGNOSIS — Z9861 Coronary angioplasty status: Secondary | ICD-10-CM | POA: Diagnosis not present

## 2015-10-30 DIAGNOSIS — I119 Hypertensive heart disease without heart failure: Secondary | ICD-10-CM | POA: Diagnosis not present

## 2015-10-30 DIAGNOSIS — Z6835 Body mass index (BMI) 35.0-35.9, adult: Secondary | ICD-10-CM | POA: Diagnosis not present

## 2015-10-30 DIAGNOSIS — E668 Other obesity: Secondary | ICD-10-CM | POA: Diagnosis not present

## 2015-10-30 DIAGNOSIS — I10 Essential (primary) hypertension: Secondary | ICD-10-CM | POA: Diagnosis not present

## 2015-11-01 ENCOUNTER — Ambulatory Visit: Payer: 59 | Attending: Student | Admitting: Physical Therapy

## 2015-11-01 ENCOUNTER — Encounter: Payer: Self-pay | Admitting: Physical Therapy

## 2015-11-01 DIAGNOSIS — M6281 Muscle weakness (generalized): Secondary | ICD-10-CM | POA: Insufficient documentation

## 2015-11-01 DIAGNOSIS — M25571 Pain in right ankle and joints of right foot: Secondary | ICD-10-CM | POA: Diagnosis not present

## 2015-11-01 DIAGNOSIS — R262 Difficulty in walking, not elsewhere classified: Secondary | ICD-10-CM | POA: Diagnosis not present

## 2015-11-01 DIAGNOSIS — M25572 Pain in left ankle and joints of left foot: Secondary | ICD-10-CM | POA: Diagnosis not present

## 2015-11-01 NOTE — Therapy (Signed)
[place], [place], [address] Phone: [phone]   Fax:  [phone]  Physical Therapy Evaluation  Patient Details  Name: Samantha Lester MRN: [id number] Date of Birth: 12/10/1965 Referring Provider: Mechele Claude Pa-C  Encounter Date: 11/01/2015      PT End of Session - 11/01/15 1725    Visit Number 1   Number of Visits 13   Date for PT Re-Evaluation 12/13/15   Authorization Type [employer] employee   PT Start Time 1549   PT Stop Time 1630   PT Time Calculation (min) 41 min   Activity Tolerance Patient tolerated treatment well;No increased pain   Behavior During Therapy [facility] for tasks assessed/performed      Past Medical History  Diagnosis Date  . Hypertension   . GERD (gastroesophageal reflux disease)   . Hyperlipidemia   . Coronary artery dissection     Past Surgical History  Procedure Laterality Date  . Cardiac catheterization N/A 01/09/2015    Procedure: Left Heart Cath and Coronary Angiography;  Surgeon: Peter M Martinique, MD;  Location: [place] [facility];  Service: Cardiovascular;  Laterality: N/A;  . Cardiac catheterization N/A 01/12/2015    Procedure: Left Heart Cath and Coronary Angiography;  Surgeon: Sherren Mocha, MD;  Location: [place] [facility];  Service: Cardiovascular;  Laterality: N/A;    There were no vitals filed for this visit.       Subjective Assessment - 11/01/15 1557    Subjective Pt reports problem is chronic. Approx 12 years ago pain was miserable, found [employer] brand shoes which made pain go away, eventually was able to wear other shoes. Wears out shoes on post-lat aspect. Descending stairs is painful and feels legs are going to give out.    Patient Stated Goals decrease pain   Currently in Pain? Yes   Pain Score 6    Pain Location Leg   Pain Orientation Right;Left   Pain Descriptors / Indicators Heaviness   Pain Type Chronic pain   Pain Onset More than a month ago   Pain Frequency Intermittent   Aggravating Factors  standing, walking   Pain Relieving Factors rest, stretching   Effect of Pain on Daily Activities limited due to pain; limited day after as well due to DOMS            [facility] PT Assessment - 11/01/15 0001    Assessment   Medical Diagnosis bilateral tibial tendonitis   Referring Provider Mechele Claude Pa-C   Onset Date/Surgical Date --  no surgery   Hand Dominance Right   Next MD Visit 2 months   Prior Therapy none   Precautions   Precautions None   Restrictions   Weight Bearing Restrictions No   Balance Screen   Has the patient fallen in the past 6 months No   Has the patient had a decrease in activity level because of a fear of falling?  No   Is the patient reluctant to leave their home because of a fear of falling?  No   Home Environment   Living Environment Private residence   Available Help at Discharge Family   Type of [place]   Prior Function   Level of [place] Full time employment  surgical technoligist in neuro [place] Requirements lifting trays, set up tables, standing all day, wearing 15lb lead apron   Cognition   Overall Cognitive Status Within Functional Limits for tasks assessed  Observation/Other Assessments   Observations notable swelling in posterior arch of bilat feet at post tibial tendon   ROM / Strength   AROM / PROM / Strength Strength;AROM   AROM   AROM Assessment Site Ankle   Right/Left Ankle Right;Left   Right Ankle Dorsiflexion 4  active DF paired with eversion   Left Ankle Dorsiflexion 5  active DF paired with eversion   Strength   Strength Assessment Site Hip;Knee;Ankle   Right/Left Hip Right;Left   Right Hip Extension 4-/5   Right Hip ABduction 4-/5   Left Hip Extension 3+/5   Left Hip ABduction 3+/5   Right/Left Knee Right;Left   Right/Left Ankle Right;Left   Right Ankle Dorsiflexion 4/5   Right Ankle Inversion 4-/5  pain   Right Ankle  Eversion 4/5   Left Ankle Dorsiflexion 5/5   Left Ankle Inversion 4-/5   Left Ankle Eversion 4/5   Palpation   Palpation comment tender to palpation bilat post tibialis and anterior tibialis                           PT Education - 11/01/15 1724    Education provided Yes   Education Details shoe wear, plan of care, HEP, anatomy of condition   Person(s) Educated Patient   Methods Explanation;Demonstration;Verbal cues;Handout   Comprehension Verbalized understanding;Returned demonstration;Verbal cues required;Need further instruction          PT Short Term Goals - 11/01/15 1731    PT SHORT TERM GOAL #1   Title Decreased pain to average of 4/10 by 5/18   Time 3   Period Weeks   Status New   PT SHORT TERM GOAL #2   Title ablet to perform toe yoga by 5/18   Time 3   Period Weeks   Status New   PT SHORT TERM GOAL #3   Title pt will verbalize decrease in leg "heaviness" by 5/18   Time 3   Period Weeks   Status New           PT Long Term Goals - 11/01/15 1732    PT LONG TERM GOAL #1   Title able to descend stairs without pain by 6/8   Time 6   Period Weeks   Status New   PT LONG TERM GOAL #2   Title Pt will report decrease in pain experiences as result of DOMS by 6/8   Time 6   Period Weeks   Status New   PT LONG TERM GOAL #3   Title Pt will have MMT 4+/5 in all motions by 6/8   Time 6   Period Weeks   Status New   PT LONG TERM GOAL #4   Title Pt will demonstrate appropraite heel toe gait without verbal cues by 6/8   Baseline excessive external rotation and flat foot at eval   Time 6   Period Weeks   Status New               Plan - 11/01/15 1727    Clinical Impression Statement Pt presents with pes planus that has resulted in tibial tendonitis both posteriorly and anteriorly. Pt job requires that she is standing for long periods and results in pain later in the day. Anterior and post tibialis are both tender to palpation and  excessively lengthened. Pt noted to have weakness at hips as well as around ankles. Pt will benefit from skilled PT in order to improve  strength and endurance along lower extremity biomechanical chain to decrease excessive strain on posterior tibialis.    Rehab Potential Good   Clinical Impairments Affecting Rehab Potential chronic tendonitis   PT Frequency 2x / week   PT Duration 6 weeks   PT Treatment/Interventions ADLs/Self Care Home Management;Cryotherapy;Electrical Stimulation;Iontophoresis 4mg /ml Dexamethasone;Moist Heat;Therapeutic exercise;Therapeutic activities;Functional mobility training;Stair training;Gait training;Contrast Bath;Ultrasound;Balance training;Neuromuscular re-education;Patient/family education;Orthotic Fit/Training;Manual techniques;Vasopneumatic Device;Taping;Dry needling;Passive range of motion   PT Next Visit Plan review HEP; hip extension, hip abduction, toe scrunches, nustep (increase resistance within tolerance of pain)   PT Home Exercise Plan short foot, toe yoga, clamshells   Consulted and Agree with Plan of Care Patient      Patient will benefit from skilled therapeutic intervention in order to improve the following deficits and impairments:  Abnormal gait, Difficulty walking, Decreased activity tolerance, Pain, Decreased strength, Increased edema, Postural dysfunction  Visit Diagnosis: Pain in right ankle and joints of right foot - Plan: PT plan of care cert/re-cert  Pain in left ankle and joints of left foot - Plan: PT plan of care cert/re-cert  Muscle weakness (generalized) - Plan: PT plan of care cert/re-cert  Difficulty in walking, not elsewhere classified - Plan: PT plan of care cert/re-cert     Problem List Patient Active Problem List   Diagnosis Date Noted  . Ischemic cardiomyopathy 02/05/2015  . Hyperlipidemia 02/05/2015  . Essential hypertension 01/09/2015  . Coronary artery dissection   . NSTEMI (non-ST elevated myocardial infarction)  ([place]) 01/08/2015    Alyvia Derk C. Greidys Deland PT, DPT 11/01/2015 5:37 PM   [facility] [address] [place], [place], [address] Phone: [phone]   Fax:  [phone]  Name: Samantha Lester MRN: [id number] Date of Birth: Mar 25, 1966

## 2015-11-07 NOTE — Progress Notes (Signed)
HPI: FU CAD. Admitted 7/16 with NSTEMI. Echo 7/16 showed normal LV function and mild MR. 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 EF 40-45. CVTS saw patient but felt CABG not indicated. Patient treated medically. FU cath 01/12/15 showed improvement of dissection with residual 75 OM1 and 80 distal Lcx. Since last seen, There is no dyspnea. She has occasional pain in her left chest area but no symptoms similar to her myocardial infarction and no exertional chest pain. Last week she had episodes of dizziness in the operating room but no syncope.  Current Outpatient Prescriptions  Medication Sig Dispense Refill  . aspirin EC 81 MG EC tablet Take 1 tablet (81 mg total) by mouth daily.    Marland Kitchen atorvastatin (LIPITOR) 20 MG tablet Take 1 tablet (20 mg total) by mouth daily. 30 tablet 6  . citalopram (CELEXA) 40 MG tablet Take 40 mg by mouth daily. Reported on 11/01/2015    . famotidine (PEPCID) 20 MG tablet Take 20 mg by mouth daily as needed for heartburn or indigestion.     . isosorbide mononitrate (IMDUR) 60 MG 24 hr tablet Take 1 tablet (60 mg total) by mouth daily. (Patient not taking: Reported on 11/01/2015) 90 tablet 3  . levothyroxine (SYNTHROID, LEVOTHROID) 50 MCG tablet Take 1 tablet (50 mcg total) by mouth daily. 30 tablet 0  . lisinopril (PRINIVIL,ZESTRIL) 20 MG tablet Take 1 tablet (20 mg total) by mouth daily. 90 tablet 3  . Naltrexone-Bupropion HCl ER (CONTRAVE) 8-90 MG TB12 Take 90 mg by mouth 1 day or 1 dose.    . nitroGLYCERIN (NITROSTAT) 0.4 MG SL tablet Place 1 tablet (0.4 mg total) under the tongue every 5 (five) minutes x 3 doses as needed for chest pain. 25 tablet 12   No current facility-administered medications for this visit.     Past Medical History  Diagnosis Date  . Hypertension   . GERD (gastroesophageal reflux disease)   . Hyperlipidemia   . Coronary artery dissection     Past Surgical History    Procedure Laterality Date  . Cardiac catheterization N/A 01/09/2015    Procedure: Left Heart Cath and Coronary Angiography;  Surgeon: Peter M Martinique, MD;  Location: [place] [facility];  Service: Cardiovascular;  Laterality: N/A;  . Cardiac catheterization N/A 01/12/2015    Procedure: Left Heart Cath and Coronary Angiography;  Surgeon: Sherren Mocha, MD;  Location: [place] [facility];  Service: Cardiovascular;  Laterality: N/A;    Social History   Social History  . Marital Status: Married    Spouse Name: N/A  . Number of Children: N/A  . Years of Education: N/A   Occupational History  . Not on file.   Social History Main Topics  . Smoking status: Never Smoker   . Smokeless tobacco: Not on file  . Alcohol Use: No  . Drug Use: No  . Sexual Activity: Yes   Other Topics Concern  . Not on file   Social History Narrative    Family History  Problem Relation Age of Onset  . Hypertension Mother   . Healthy Father   . Kidney disease Sister   . Hypertension Sister   . Healthy Sister   . Drug abuse Brother   . Alcohol abuse Brother   . Hypertension Brother     ROS: no fevers or chills, productive cough, hemoptysis, dysphasia, odynophagia, melena, hematochezia, dysuria, hematuria, rash, seizure activity,  orthopnea, PND, pedal edema, claudication. Remaining systems are negative.  Physical Exam: Well-developed well-nourished in no acute distress.  Skin is warm and dry.  HEENT is normal.  Neck is supple.  Chest is clear to auscultation with normal expansion.  Cardiovascular exam is regular rate and rhythm.  Abdominal exam nontender or distended. No masses palpated. Extremities show no edema. neuro grossly intact  ECG Sinus rhythm at a rate of 86. No ST changes.

## 2015-11-12 ENCOUNTER — Ambulatory Visit (INDEPENDENT_AMBULATORY_CARE_PROVIDER_SITE_OTHER): Payer: 59 | Admitting: Cardiology

## 2015-11-12 ENCOUNTER — Encounter: Payer: Self-pay | Admitting: Cardiology

## 2015-11-12 VITALS — BP 140/90 | HR 86 | Ht 61.0 in | Wt 190.0 lb

## 2015-11-12 DIAGNOSIS — I2510 Atherosclerotic heart disease of native coronary artery without angina pectoris: Secondary | ICD-10-CM | POA: Diagnosis not present

## 2015-11-12 DIAGNOSIS — I10 Essential (primary) hypertension: Secondary | ICD-10-CM

## 2015-11-12 DIAGNOSIS — R55 Syncope and collapse: Secondary | ICD-10-CM | POA: Diagnosis not present

## 2015-11-12 DIAGNOSIS — E785 Hyperlipidemia, unspecified: Secondary | ICD-10-CM

## 2015-11-12 MED ORDER — CARVEDILOL 3.125 MG PO TABS: 3.1250 mg | ORAL_TABLET | Freq: Two times a day (BID) | ORAL | Status: DC

## 2015-11-12 MED ORDER — ATORVASTATIN CALCIUM 80 MG PO TABS: 80.0000 mg | ORAL_TABLET | Freq: Every day | ORAL | Status: DC

## 2015-11-12 MED FILL — ATORVASTATIN 80 MG TABLET: 80 | 90 days supply | Qty: 90 | Fill #0 | Status: TO

## 2015-11-12 MED FILL — CARVEDILOL 3.125 MG TABLET: 3.125 | 90 days supply | Qty: 180 | Fill #0

## 2015-11-12 NOTE — Assessment & Plan Note (Addendum)
Blood pressure is borderline. She did not tolerate Toprol. We will try low-dose Coreg and advance if she tolerates.

## 2015-11-12 NOTE — Assessment & Plan Note (Signed)
Patient had recent dizziness in the operating room. This sounds to be a vaguely mediated event. We will plan further workup if she has recurrence in the future. She should stay hydrated.

## 2015-11-12 NOTE — Assessment & Plan Note (Signed)
Mildly decreased in the past. She had fatigue on metoprolol. I will try Coreg 3.125 mg twice a day and increase as tolerated.

## 2015-11-12 NOTE — Patient Instructions (Signed)
Medication Instructions:   INCREASE ATORVASTATIN TO 80 MG ONCE DAILY= 4 OF THE 20 MG TABLETS ONCE DAILY  START CARVEDILOL 3.125 MG ONE TABLET TWICE DAILY  Labwork:  Your physician recommends that you return for lab work in: 4 WEEKS= DO NOT EAT PRIOR TO LAB WORK  Follow-Up:  Your physician wants you to follow-up in: [place] will receive a reminder letter in the mail two months in advance. If you don't receive a letter, please call our office to schedule the follow-up appointment.   If you need a refill on your cardiac medications before your next appointment, please call your pharmacy.

## 2015-11-12 NOTE — Assessment & Plan Note (Signed)
Given documented coronary disease we will increase Lipitor to 80 mg daily. Check lipids and liver in 4 weeks.

## 2015-11-12 NOTE — Assessment & Plan Note (Signed)
Patient with history of coronary artery dissection and coronary disease. Continue aspirin and statin. She has had no symptoms similar to her infarct pain. Electrocardiogram is normal.

## 2015-11-13 ENCOUNTER — Ambulatory Visit: Payer: 59 | Attending: Student | Admitting: Physical Therapy

## 2015-11-13 DIAGNOSIS — R262 Difficulty in walking, not elsewhere classified: Secondary | ICD-10-CM | POA: Insufficient documentation

## 2015-11-13 DIAGNOSIS — M25571 Pain in right ankle and joints of right foot: Secondary | ICD-10-CM | POA: Insufficient documentation

## 2015-11-13 DIAGNOSIS — M6281 Muscle weakness (generalized): Secondary | ICD-10-CM | POA: Diagnosis not present

## 2015-11-13 DIAGNOSIS — M25572 Pain in left ankle and joints of left foot: Secondary | ICD-10-CM | POA: Insufficient documentation

## 2015-11-14 NOTE — Therapy (Signed)
[place], [place], [address] Phone: [phone]   Fax:  [phone]  Physical Therapy Treatment  Patient Details  Name: Samantha Lester MRN: [id number] Date of Birth: 08/23/1965 Referring Provider: Mechele Claude Pa-C  Encounter Date: 11/13/2015      PT End of Session - 11/14/15 0844    Visit Number 2   Number of Visits 13   Date for PT Re-Evaluation 12/13/15   Authorization Type [employer] employee   PT Start Time 1545   PT Stop Time 1625   PT Time Calculation (min) 40 min   Activity Tolerance Patient tolerated treatment well   Behavior During Therapy [facility] for tasks assessed/performed      Past Medical History  Diagnosis Date  . Hypertension   . GERD (gastroesophageal reflux disease)   . Hyperlipidemia   . Coronary artery dissection     Past Surgical History  Procedure Laterality Date  . Cardiac catheterization N/A 01/09/2015    Procedure: Left Heart Cath and Coronary Angiography;  Surgeon: Peter M Martinique, MD;  Location: [place] [facility];  Service: Cardiovascular;  Laterality: N/A;  . Cardiac catheterization N/A 01/12/2015    Procedure: Left Heart Cath and Coronary Angiography;  Surgeon: Sherren Mocha, MD;  Location: [place] [facility];  Service: Cardiovascular;  Laterality: N/A;    There were no vitals filed for this visit.      Subjective Assessment - 11/14/15 0843    Subjective Patient reports less pain at work. She continues to have shooting pain though at times She has been doing her exercises.    Currently in Pain? Yes   Pain Score 6    Pain Location Leg   Pain Orientation Right;Left   Pain Type Chronic pain   Pain Onset More than a month ago                                 PT Education - 11/14/15 0844    Education Details HEP    Person(s) Educated Patient   Methods Explanation;Demonstration   Comprehension Verbalized understanding;Returned  demonstration;Verbal cues required;Need further instruction          PT Short Term Goals - 11/14/15 0850    PT SHORT TERM GOAL #1   Title Decreased pain to average of 4/10 by 5/18   Time 3   Period Weeks   Status On-going   PT SHORT TERM GOAL #2   Title ablet to perform toe yoga by 5/18   Baseline performed with min cuing    Time 3   Period Weeks   Status On-going   PT SHORT TERM GOAL #3   Title pt will verbalize decrease in leg "heaviness" by 5/18   Time 3   Period Weeks   Status New           PT Long Term Goals - 11/01/15 1732    PT LONG TERM GOAL #1   Title able to descend stairs without pain by 6/8   Time 6   Period Weeks   Status New   PT LONG TERM GOAL #2   Title Pt will report decrease in pain experiences as result of DOMS by 6/8   Time 6   Period Weeks   Status New   PT LONG TERM GOAL #3   Title Pt will have MMT 4+/5 in all motions by 6/8   Time 6  Period Weeks   Status New   PT LONG TERM GOAL #4   Title Pt will demonstrate appropraite heel toe gait without verbal cues by 6/8   Baseline excessive external rotation and flat foot at eval   Time 6   Period Weeks   Status New               Plan - 11/14/15 0845    Clinical Impression Statement Pt tolerated treatment well. Therapy added ankle stability exercises and standing hip and knee stregthening exercises. She reportded no increase in pain. She declined ice   PT Frequency 2x / week   PT Duration 6 weeks   PT Treatment/Interventions ADLs/Self Care Home Management;Cryotherapy;Electrical Stimulation;Iontophoresis 4mg /ml Dexamethasone;Moist Heat;Therapeutic exercise;Therapeutic activities;Functional mobility training;Stair training;Gait training;Contrast Bath;Ultrasound;Balance training;Neuromuscular re-education;Patient/family education;Orthotic Fit/Training;Manual techniques;Vasopneumatic Device;Taping;Dry needling;Passive range of motion   PT Next Visit Plan review HEP; hip extension, hip  abduction, toe scrunches, nustep (increase resistance within tolerance of pain)   PT Home Exercise Plan short foot, toe yoga, clamshells; mini squats, ankle 4 way hip abduction, hip extension,    Consulted and Agree with Plan of Care Patient      Patient will benefit from skilled therapeutic intervention in order to improve the following deficits and impairments:  Abnormal gait, Difficulty walking, Decreased activity tolerance, Pain, Decreased strength, Increased edema, Postural dysfunction  Visit Diagnosis: Pain in right ankle and joints of right foot  Pain in left ankle and joints of left foot  Muscle weakness (generalized)  Difficulty in walking, not elsewhere classified     Problem List Patient Active Problem List   Diagnosis Date Noted  . Near syncope 11/12/2015  . Ischemic cardiomyopathy 02/05/2015  . Hyperlipidemia 02/05/2015  . Essential hypertension 01/09/2015  . Coronary artery dissection   . NSTEMI (non-ST elevated myocardial infarction) ([place]) 01/08/2015    Carney Living 11/14/2015, 1:32 PM  [facility] [address] [place], [place], [address] Phone: [phone]   Fax:  [phone]  Name: Samantha Lester MRN: [id number] Date of Birth: 04/02/1966

## 2015-11-15 ENCOUNTER — Ambulatory Visit: Payer: 59 | Admitting: Physical Therapy

## 2015-11-19 ENCOUNTER — Ambulatory Visit: Payer: 59 | Admitting: Physical Therapy

## 2015-11-19 DIAGNOSIS — M25572 Pain in left ankle and joints of left foot: Secondary | ICD-10-CM

## 2015-11-19 DIAGNOSIS — R262 Difficulty in walking, not elsewhere classified: Secondary | ICD-10-CM | POA: Diagnosis not present

## 2015-11-19 DIAGNOSIS — M25571 Pain in right ankle and joints of right foot: Secondary | ICD-10-CM

## 2015-11-19 DIAGNOSIS — M6281 Muscle weakness (generalized): Secondary | ICD-10-CM | POA: Diagnosis not present

## 2015-11-19 NOTE — Therapy (Signed)
[place], [place], [address] Phone: [phone]   Fax:  [phone]  Physical Therapy Treatment  Patient Details  Name: Samantha Lester MRN: [id number] Date of Birth: 1966/04/03 Referring Provider: Mechele Claude Pa-C  Encounter Date: 11/19/2015      PT End of Session - 11/19/15 1802    Visit Number 3   Number of Visits 13   Date for PT Re-Evaluation 12/13/15   PT Start Time 3785   PT Stop Time 1630   PT Time Calculation (min) 45 min   Activity Tolerance Patient tolerated treatment well   Behavior During Therapy [facility] for tasks assessed/performed      Past Medical History  Diagnosis Date  . Hypertension   . GERD (gastroesophageal reflux disease)   . Hyperlipidemia   . Coronary artery dissection     Past Surgical History  Procedure Laterality Date  . Cardiac catheterization N/A 01/09/2015    Procedure: Left Heart Cath and Coronary Angiography;  Surgeon: Peter M Martinique, MD;  Location: [place] [facility];  Service: Cardiovascular;  Laterality: N/A;  . Cardiac catheterization N/A 01/12/2015    Procedure: Left Heart Cath and Coronary Angiography;  Surgeon: Sherren Mocha, MD;  Location: [place] [facility];  Service: Cardiovascular;  Laterality: N/A;    There were no vitals filed for this visit.      Subjective Assessment - 11/19/15 1548    Subjective Average pain 3 to 5/10.  Had pain 5/10 the next day after PT.  She does not consistantly do her home exercises.  She does her toe yoga sometimes standing at work.  Uses ice massage and rolls foot over frozen bottles    Currently in Pain? Yes   Pain Score 5    Pain Location Leg   Pain Orientation Right;Left   Pain Descriptors / Indicators Heaviness   Pain Frequency Intermittent   Aggravating Factors  walking long distances,  standing all day   Pain Relieving Factors wearing supportive shoes                         [facility] Adult PT  Treatment/Exercise - 11/19/15 1613    Lumbar Exercises: Quadruped   Straight Leg Raises Limitations bent knee hip extension 10 X, cues  each leg cued neutral spine   Knee/Hip Exercises: Standing   Gait Training Yoga walking focus inner thigh to get 4 points of feet on the ground with gait,  calcaneous more neutral with this.    Knee/Hip Exercises: Supine   Bridges Limitations 10 X, cues breathing  both   Knee/Hip Exercises: Sidelying   Clams 10 X each side   Manual Therapy   Manual therapy comments retrograde  soft tissue work,  friction massage cross, at patient's request.     Ankle Exercises: Seated   Other Seated Ankle Exercises ankle 4 way 1x10 with yellow t-band    Ankle Exercises: Standing   Other Standing Ankle Exercises toe yoga in shoes, gluteal sets standing 5-10 X each                PT Education - 11/19/15 1801    Education provided Yes   Education Details gait,  exercise stratigies, how to do retorgrade for swelling   Person(s) Educated Patient   Methods Explanation;Demonstration   Comprehension Verbalized understanding;Returned demonstration          PT Short Term Goals - 11/19/15 1805    PT  SHORT TERM GOAL #1   Title Decreased pain to average of 4/10 by 5/18   Baseline 3-5/10   Time 3   Period Weeks   Status On-going   PT SHORT TERM GOAL #2   Title ablet to perform toe yoga by 5/18   Baseline can do some   Time 3   Period Weeks   Status Partially Met   PT SHORT TERM GOAL #3   Title pt will verbalize decrease in leg "heaviness" by 5/18   Baseline leg still heavy   Time 3   Period Weeks   Status On-going           PT Long Term Goals - 11/01/15 1732    PT LONG TERM GOAL #1   Title able to descend stairs without pain by 6/8   Time 6   Period Weeks   Status New   PT LONG TERM GOAL #2   Title Pt will report decrease in pain experiences as result of DOMS by 6/8   Time 6   Period Weeks   Status New   PT LONG TERM GOAL #3   Title Pt  will have MMT 4+/5 in all motions by 6/8   Time 6   Period Weeks   Status New   PT LONG TERM GOAL #4   Title Pt will demonstrate appropraite heel toe gait without verbal cues by 6/8   Baseline excessive external rotation and flat foot at eval   Time 6   Period Weeks   Status New               Plan - 11/19/15 1802    Clinical Impression Statement progressed toward home exercise goals.  I think she is doing the easier exercises more frequantly at home/work vs the more difficult ones.   She has no increased pain she felt exercises post session declining the need of ice.    PT Next Visit Plan review hip exercises,  continue strengthening,  manual as needed.    PT Home Exercise Plan hip strengthening,  yoga walking (focus on moving inner thigh behind you) continue previous exerciese, even the harder ones.    Consulted and Agree with Plan of Care Patient      Patient will benefit from skilled therapeutic intervention in order to improve the following deficits and impairments:  Abnormal gait, Difficulty walking, Decreased activity tolerance, Pain, Decreased strength, Increased edema, Postural dysfunction  Visit Diagnosis: Pain in right ankle and joints of right foot  Pain in left ankle and joints of left foot  Muscle weakness (generalized)  Difficulty in walking, not elsewhere classified     Problem List Patient Active Problem List   Diagnosis Date Noted  . Near syncope 11/12/2015  . Ischemic cardiomyopathy 02/05/2015  . Hyperlipidemia 02/05/2015  . Essential hypertension 01/09/2015  . Coronary artery dissection   . NSTEMI (non-ST elevated myocardial infarction) ([place]) 01/08/2015    Criston Chancellor 11/19/2015, 6:08 PM  [facility] [address] [place], [place], [address] Phone: [phone]   Fax:  [phone]  Name: Samantha Lester MRN: [id number] Date of Birth: 07-Aug-1965    Melvenia Needles, PTA 11/19/2015 6:08  PM Phone: [phone] Fax: [phone]

## 2015-11-22 ENCOUNTER — Ambulatory Visit: Payer: 59 | Admitting: Physical Therapy

## 2015-11-22 ENCOUNTER — Encounter: Payer: Self-pay | Admitting: Physical Therapy

## 2015-11-22 DIAGNOSIS — R262 Difficulty in walking, not elsewhere classified: Secondary | ICD-10-CM | POA: Diagnosis not present

## 2015-11-22 DIAGNOSIS — M6281 Muscle weakness (generalized): Secondary | ICD-10-CM | POA: Diagnosis not present

## 2015-11-22 DIAGNOSIS — M25572 Pain in left ankle and joints of left foot: Secondary | ICD-10-CM

## 2015-11-22 DIAGNOSIS — M25571 Pain in right ankle and joints of right foot: Secondary | ICD-10-CM | POA: Diagnosis not present

## 2015-11-22 NOTE — Therapy (Signed)
[place], [place], [address] Phone: [phone]   Fax:  [phone]  Physical Therapy Treatment  Patient Details  Name: Samantha Lester MRN: [id number] Date of Birth: Nov 14, 1965 Referring Provider: Mechele Claude Pa-C  Encounter Date: 11/22/2015      PT End of Session - 11/22/15 1551    Visit Number 4   Number of Visits 13   Date for PT Re-Evaluation 12/13/15   Authorization Type [employer] employee   PT Start Time 1549   PT Stop Time 1630   PT Time Calculation (min) 41 min   Activity Tolerance Patient tolerated treatment well   Behavior During Therapy [facility] for tasks assessed/performed      Past Medical History  Diagnosis Date  . Hypertension   . GERD (gastroesophageal reflux disease)   . Hyperlipidemia   . Coronary artery dissection     Past Surgical History  Procedure Laterality Date  . Cardiac catheterization N/A 01/09/2015    Procedure: Left Heart Cath and Coronary Angiography;  Surgeon: Peter M Martinique, MD;  Location: [place] [facility];  Service: Cardiovascular;  Laterality: N/A;  . Cardiac catheterization N/A 01/12/2015    Procedure: Left Heart Cath and Coronary Angiography;  Surgeon: Sherren Mocha, MD;  Location: [place] [facility];  Service: Cardiovascular;  Laterality: N/A;    There were no vitals filed for this visit.      Subjective Assessment - 11/22/15 1552    Subjective Sat indian-style on floor on Saturday and had increased pain. Feels that the exercises are helping, they are gradually getting better. Pain performing resisted PF and inversion.    How long can you stand comfortably? 30 min   Currently in Pain? Yes   Pain Score 3    Pain Descriptors / Indicators Shooting;Dull   Aggravating Factors  walking long distances, standing all day, steps/stairs                         [facility] Adult PT Treatment/Exercise - 11/22/15 0001    Knee/Hip Exercises: Stretches   Other  Knee/Hip Stretches butterfly 2 min, 5 sec holds   Knee/Hip Exercises: Aerobic   Stationary Bike 5 min L5   Knee/Hip Exercises: Supine   Bridges with Clamshell 20 reps   Knee/Hip Exercises: Sidelying   Hip ABduction 20 reps   Knee/Hip Exercises: Prone   Hip Extension Limitations x20 ea with knee flexed to 90   Manual Therapy   Manual Therapy Taping   Kinesiotex Facilitate Muscle  posterior tibialis bilateral   Ankle Exercises: Machines for Strengthening   Cybex Leg Press plantar flexion bar                PT Education - 11/22/15 1743    Education provided Yes   Education Details taping, exercise form/rationale   Person(s) Educated Patient   Methods Explanation;Demonstration;Tactile cues;Verbal cues   Comprehension Verbalized understanding;Returned demonstration;Verbal cues required;Tactile cues required;Need further instruction          PT Short Term Goals - 11/19/15 1805    PT SHORT TERM GOAL #1   Title Decreased pain to average of 4/10 by 5/18   Baseline 3-5/10   Time 3   Period Weeks   Status On-going   PT SHORT TERM GOAL #2   Title ablet to perform toe yoga by 5/18   Baseline can do some   Time 3   Period Weeks   Status Partially  Met   PT SHORT TERM GOAL #3   Title pt will verbalize decrease in leg "heaviness" by 5/18   Baseline leg still heavy   Time 3   Period Weeks   Status On-going           PT Long Term Goals - 11/01/15 1732    PT LONG TERM GOAL #1   Title able to descend stairs without pain by 6/8   Time 6   Period Weeks   Status New   PT LONG TERM GOAL #2   Title Pt will report decrease in pain experiences as result of DOMS by 6/8   Time 6   Period Weeks   Status New   PT LONG TERM GOAL #3   Title Pt will have MMT 4+/5 in all motions by 6/8   Time 6   Period Weeks   Status New   PT LONG TERM GOAL #4   Title Pt will demonstrate appropraite heel toe gait without verbal cues by 6/8   Baseline excessive external rotation and flat  foot at eval   Time 6   Period Weeks   Status New               Plan - 11/22/15 1743    Clinical Impression Statement Pt responded positively to taping today, verbalizing improvement in pain and felt more supported. Discussed wear of danskos and effect of lifted heel on achilles length. Will continue to benefit from strengthening of LE biomechanical chain to provide support to ankles and feet.    PT Next Visit Plan retape along post tib prior to beginning exercises    PT Home Exercise Plan hip strengthening,  yoga walking (focus on moving inner thigh behind you) continue previous exerciese, even the harder ones.    Consulted and Agree with Plan of Care Patient      Patient will benefit from skilled therapeutic intervention in order to improve the following deficits and impairments:  Abnormal gait, Difficulty walking, Decreased activity tolerance, Pain, Decreased strength, Increased edema, Postural dysfunction  Visit Diagnosis: Pain in right ankle and joints of right foot  Pain in left ankle and joints of left foot  Muscle weakness (generalized)  Difficulty in walking, not elsewhere classified     Problem List Patient Active Problem List   Diagnosis Date Noted  . Near syncope 11/12/2015  . Ischemic cardiomyopathy 02/05/2015  . Hyperlipidemia 02/05/2015  . Essential hypertension 01/09/2015  . Coronary artery dissection   . NSTEMI (non-ST elevated myocardial infarction) ([place]) 01/08/2015    Samantha Lester PT, DPT 11/22/2015 5:48 PM   [place] [facility] [address] [place], [place], [address] Phone: [phone]   Fax:  [phone]  Name: Samantha Lester MRN: [id number] Date of Birth: 1965/10/10

## 2015-12-10 ENCOUNTER — Ambulatory Visit: Payer: 59 | Attending: Student | Admitting: Physical Therapy

## 2015-12-10 DIAGNOSIS — R262 Difficulty in walking, not elsewhere classified: Secondary | ICD-10-CM | POA: Diagnosis not present

## 2015-12-10 DIAGNOSIS — M25572 Pain in left ankle and joints of left foot: Secondary | ICD-10-CM | POA: Diagnosis not present

## 2015-12-10 DIAGNOSIS — M25571 Pain in right ankle and joints of right foot: Secondary | ICD-10-CM | POA: Diagnosis not present

## 2015-12-10 DIAGNOSIS — M6281 Muscle weakness (generalized): Secondary | ICD-10-CM | POA: Insufficient documentation

## 2015-12-10 NOTE — Therapy (Addendum)
[place], [place], [address] Phone: [phone]   Fax:  [phone]  Physical Therapy Treatment  Patient Details  Name: Samantha Lester MRN: [id number] Date of Birth: 1966/02/12 Referring Provider: Mechele Claude Pa-C  Encounter Date: 12/10/2015      PT End of Session - 12/10/15 1703    Visit Number 5   Number of Visits 13   Date for PT Re-Evaluation 12/13/15   PT Start Time 1600   PT Stop Time 1645   PT Time Calculation (min) 45 min   Activity Tolerance Patient tolerated treatment well   Behavior During Therapy [facility] for tasks assessed/performed      Past Medical History  Diagnosis Date  . Hypertension   . GERD (gastroesophageal reflux disease)   . Hyperlipidemia   . Coronary artery dissection     Past Surgical History  Procedure Laterality Date  . Cardiac catheterization N/A 01/09/2015    Procedure: Left Heart Cath and Coronary Angiography;  Surgeon: Peter M Martinique, MD;  Location: [place] [facility];  Service: Cardiovascular;  Laterality: N/A;  . Cardiac catheterization N/A 01/12/2015    Procedure: Left Heart Cath and Coronary Angiography;  Surgeon: Sherren Mocha, MD;  Location: [place] [facility];  Service: Cardiovascular;  Laterality: N/A;    There were no vitals filed for this visit.      Subjective Assessment - 12/10/15 1603    Subjective Less shooting pains with less weight(Lead apron)Able to recite her home exercises. In the backyard My ankle twisted a lot. It takes nothing to Less pain with going down steps.     Pain Score 3    Pain Location Leg   Pain Orientation Right;Left;Lateral   Pain Descriptors / Indicators Heaviness;Shooting;Dull   Pain Frequency Intermittent   Aggravating Factors  Getting foot bumped.  The next day after walking 1/2 mile   Pain Relieving Factors rest,  ICE                         [facility] Adult PT Treatment/Exercise - 12/10/15 1613    Self-Care    Self-Care --  how to tape feet.  3 I strips,  Patient wanted more support    Knee/Hip Exercises: Stretches   Passive Hamstring Stretch 3 reps;30 seconds   Other Knee/Hip Stretches butterfly2 minutes 5 seconds holds,  multiple reps   Knee/Hip Exercises: Aerobic   Stationary Bike 6 minutes   Knee/Hip Exercises: Supine   Bridges with Clamshell 10 reps;2 sets  clams   Knee/Hip Exercises: Prone   Hip Extension Limitations 20 x with kne flexed  each leg   Ankle Exercises: Machines for Strengthening   Cybex Leg Press Plantar plexion 10 X 1 plate, 10 X 2 plates,  both   Ankle Exercises: Stretches   Gastroc Stretch 3 reps;30 seconds  strap                PT Education - 12/10/15 1702    Education provided Yes   Education Details Taping education,  Ligament discussion/ re expectations,  Need to focus on strengthening,  Be careful with uneven surface.  Cane?   Person(s) Educated Patient   Methods Explanation;Demonstration;Tactile cues;Verbal cues   Comprehension Verbalized understanding;Returned demonstration;Need further instruction          PT Short Term Goals - 12/10/15 1707    PT SHORT TERM GOAL #1   Title Decreased pain to average of 4/10 by 5/18  Baseline Pain varies   Time 3   Period Weeks   Status On-going   PT SHORT TERM GOAL #2   Title ablet to perform toe yoga by 5/18   Baseline can do some   Time 3   Period Weeks   Status Unable to assess   PT SHORT TERM GOAL #3   Title pt will verbalize decrease in leg "heaviness" by 5/18   Baseline leg heavy the next day after walking 1/2 mile the day before   Time 3   Period Weeks   Status On-going           PT Long Term Goals - 12/10/15 1708    PT LONG TERM GOAL #1   Title able to descend stairs without pain by 6/8   Baseline able to do with less pain   Time 6   Period Weeks   Status On-going   PT LONG TERM GOAL #2   Title Pt will report decrease in pain experiences as result of DOMS by 6/8   Time 6    Period Weeks   Status On-going   PT LONG TERM GOAL #3   Title Pt will have MMT 4+/5 in all motions by 6/8   Time 6   Period Weeks   Status Unable to assess   PT LONG TERM GOAL #4   Title Pt will demonstrate appropraite heel toe gait without verbal cues by 6/8   Baseline gait improving with supportive    Time 6   Period Weeks   Status On-going               Plan - 12/10/15 1704    Clinical Impression Statement Patient able to demonstrate fair technique for taping feet.  She has purchased some for home.  She needs to change her appointment later this week due to having dental surgery the same day.  Patient says her ankle twists frequently when she walks on uneven surfaces.  She has not been here consistantly due to appointment availability. .     PT Next Visit Plan Strengthening/stretching,  POC 12/13/2015   PT Home Exercise Plan hip strengthening,  yoga walking (focus on moving inner thigh behind you) continue previous exerciese, even the harder ones.    Consulted and Agree with Plan of Care Patient      Patient will benefit from skilled therapeutic intervention in order to improve the following deficits and impairments:  Abnormal gait, Difficulty walking, Decreased activity tolerance, Pain, Decreased strength, Increased edema, Postural dysfunction  Visit Diagnosis: Pain in right ankle and joints of right foot  Pain in left ankle and joints of left foot  Muscle weakness (generalized)  Difficulty in walking, not elsewhere classified     Problem List Patient Active Problem List   Diagnosis Date Noted  . Near syncope 11/12/2015  . Ischemic cardiomyopathy 02/05/2015  . Hyperlipidemia 02/05/2015  . Essential hypertension 01/09/2015  . Coronary artery dissection   . NSTEMI (non-ST elevated myocardial infarction) ([place]) 01/08/2015    Mylea Roarty 12/10/2015, 5:10 PM  [facility] [address] [place], [place],  [address] Phone: [phone]   Fax:  [phone]  Name: Cydni Reddoch MRN: [id number] Date of Birth: [birth year]    Melvenia Needles, PTA 12/10/2015 5:10 PM Phone: [phone] Fax: [phone]   PHYSICAL THERAPY DISCHARGE SUMMARY  Visits from Start of Care: 5  Current functional level related to goals / functional outcomes: See above   Remaining deficits: See above  Education / EquipmentNurse, children's of condition, POC, HEP, exercise form/rationale  Plan: Patient agrees to discharge.  Patient goals were not met. Patient is being discharged due to not returning since the last visit.  ?????    Jessica C. Hightower PT, DPT 02/19/16 3:27 PM

## 2015-12-10 NOTE — Patient Instructions (Addendum)
How to tape ankles,  Remove if irritating,  May wear up to 5 days. May get tape wet. [place] address.

## 2015-12-13 ENCOUNTER — Encounter: Payer: 59 | Admitting: Physical Therapy

## 2015-12-13 MED FILL — CLINDAMYCIN HCL 150 MG CAP: 150 | 7 days supply | Qty: 28 | Fill #0

## 2015-12-13 MED FILL — CHLORHEXIDINE 0.12% RINSE: 0.12 | 17 days supply | Qty: 473 | Fill #0

## 2015-12-13 MED FILL — HYDROCODON-APAP 5-325: 5-325 | 2 days supply | Qty: 12 | Fill #0

## 2015-12-14 ENCOUNTER — Encounter: Payer: Self-pay | Admitting: Cardiology

## 2015-12-18 ENCOUNTER — Ambulatory Visit: Payer: 59 | Admitting: Physical Therapy

## 2015-12-24 DIAGNOSIS — M76821 Posterior tibial tendinitis, right leg: Secondary | ICD-10-CM | POA: Diagnosis not present

## 2015-12-25 ENCOUNTER — Telehealth: Payer: Self-pay | Admitting: *Deleted

## 2015-12-25 MED FILL — LEVOTHYROXINE 50 MCG TABLET: 50 | 60 days supply | Qty: 60 | Fill #1

## 2015-12-25 NOTE — Telephone Encounter (Signed)
please call pt regarding her cardiac medications, Atorvastatin & New one that she got, she hasnt been taking these due to being sick because od them, she wants to discuss this with you. thanks

## 2015-12-25 NOTE — Telephone Encounter (Signed)
Change lipitor to 20 mg daily and DC coreg [employer]

## 2015-12-25 NOTE — Telephone Encounter (Signed)
Spoke with patient and after starting the Atorvastatin and Carvedilol she began having severe headache with nausea  Patient has no history of migraines or even headaches in general  She took the medications for about 2-3 weeks  Medication stopped and headache and nausea subsided within 2 days Advised will forward to Dr Stanford Breed for review

## 2015-12-26 NOTE — Telephone Encounter (Signed)
Left message for pt to call.

## 2015-12-28 NOTE — Telephone Encounter (Signed)
Left message for pt to call.

## 2015-12-31 ENCOUNTER — Other Ambulatory Visit (HOSPITAL_COMMUNITY): Payer: Self-pay | Admitting: Orthopedic Surgery

## 2015-12-31 DIAGNOSIS — M76821 Posterior tibial tendinitis, right leg: Secondary | ICD-10-CM

## 2016-01-03 ENCOUNTER — Ambulatory Visit (HOSPITAL_COMMUNITY): Payer: 59

## 2016-01-14 ENCOUNTER — Telehealth: Payer: Self-pay | Admitting: Orthopedic Surgery

## 2016-01-14 NOTE — Telephone Encounter (Signed)
Have tried to contact patient multiple times. Sent out a letter stating to call to schedule appointment

## 2016-01-15 NOTE — Telephone Encounter (Signed)
Pt has not returned my calls.

## 2016-01-24 ENCOUNTER — Ambulatory Visit (HOSPITAL_COMMUNITY)
Admission: RE | Admit: 2016-01-24 | Discharge: 2016-01-24 | Disposition: A | Discharge status: Home / Self Care | Payer: 59 | Source: Ambulatory Visit | Attending: Orthopedic Surgery | Admitting: Orthopedic Surgery

## 2016-01-24 DIAGNOSIS — M76821 Posterior tibial tendinitis, right leg: Secondary | ICD-10-CM | POA: Insufficient documentation

## 2016-01-24 DIAGNOSIS — M659 Synovitis and tenosynovitis, unspecified: Secondary | ICD-10-CM | POA: Insufficient documentation

## 2016-01-24 DIAGNOSIS — M722 Plantar fascial fibromatosis: Secondary | ICD-10-CM | POA: Insufficient documentation

## 2016-01-24 MED FILL — LISINOPRIL 20 MG TABLET: 20 | 90 days supply | Qty: 90 | Fill #2 | Status: TO

## 2016-02-05 DIAGNOSIS — I10 Essential (primary) hypertension: Secondary | ICD-10-CM | POA: Diagnosis not present

## 2016-02-05 DIAGNOSIS — I119 Hypertensive heart disease without heart failure: Secondary | ICD-10-CM | POA: Diagnosis not present

## 2016-02-05 DIAGNOSIS — F418 Other specified anxiety disorders: Secondary | ICD-10-CM | POA: Diagnosis not present

## 2016-02-05 DIAGNOSIS — E7800 Pure hypercholesterolemia, unspecified: Secondary | ICD-10-CM | POA: Diagnosis not present

## 2016-02-05 DIAGNOSIS — Z6835 Body mass index (BMI) 35.0-35.9, adult: Secondary | ICD-10-CM | POA: Diagnosis not present

## 2016-02-05 DIAGNOSIS — I2510 Atherosclerotic heart disease of native coronary artery without angina pectoris: Secondary | ICD-10-CM | POA: Diagnosis not present

## 2016-02-05 DIAGNOSIS — E668 Other obesity: Secondary | ICD-10-CM | POA: Diagnosis not present

## 2016-02-05 DIAGNOSIS — E038 Other specified hypothyroidism: Secondary | ICD-10-CM | POA: Diagnosis not present

## 2016-02-05 DIAGNOSIS — Z9861 Coronary angioplasty status: Secondary | ICD-10-CM | POA: Diagnosis not present

## 2016-02-05 MED FILL — CONTRAVE ER 8-90 MG TABLET: 8-90 | 30 days supply | Qty: 120 | Fill #0 | Status: TO

## 2016-02-05 MED FILL — ATORVASTATIN 20 MG TABLET: 20 | 90 days supply | Qty: 90 | Fill #0

## 2016-02-25 MED FILL — LEVOTHYROXINE 50 MCG TABLET: 50 | 90 days supply | Qty: 90 | Fill #0 | Status: TO

## 2016-04-01 DIAGNOSIS — M76822 Posterior tibial tendinitis, left leg: Secondary | ICD-10-CM | POA: Diagnosis not present

## 2016-04-03 ENCOUNTER — Ambulatory Visit: Payer: 59 | Attending: Student | Admitting: Physical Therapy

## 2016-04-03 DIAGNOSIS — M6281 Muscle weakness (generalized): Secondary | ICD-10-CM | POA: Diagnosis not present

## 2016-04-03 DIAGNOSIS — R262 Difficulty in walking, not elsewhere classified: Secondary | ICD-10-CM | POA: Insufficient documentation

## 2016-04-03 DIAGNOSIS — M25571 Pain in right ankle and joints of right foot: Secondary | ICD-10-CM | POA: Insufficient documentation

## 2016-04-03 DIAGNOSIS — M25572 Pain in left ankle and joints of left foot: Secondary | ICD-10-CM | POA: Diagnosis not present

## 2016-04-04 NOTE — Patient Instructions (Signed)
Ankle Dorsi flexion with strap patient already has a picture at ome

## 2016-04-04 NOTE — Addendum Note (Signed)
Addended by: Carney Living on: 04/04/2016 12:32 PM   Modules accepted: Orders

## 2016-04-04 NOTE — Therapy (Signed)
[place], [place], [address] Phone: [phone]   Fax:  [phone]  Physical Therapy Evaluation  Patient Details  Name: Samantha Lester MRN: [id number] Date of Birth: Jan 01, 1966 Referring Provider: Valerie Salts [facility]  Encounter Date: 04/03/2016      PT End of Session - 04/03/16 1157    Visit Number 1   Number of Visits 16   Date for PT Re-Evaluation 05/29/16   Authorization Type [employer]    PT Start Time [id number]   PT Stop Time 1233   PT Time Calculation (min) 48 min   Activity Tolerance Patient tolerated treatment well   Behavior During Therapy [facility] for tasks assessed/performed      Past Medical History:  Diagnosis Date  . Coronary artery dissection   . GERD (gastroesophageal reflux disease)   . Hyperlipidemia   . Hypertension     Past Surgical History:  Procedure Laterality Date  . CARDIAC CATHETERIZATION N/A 01/09/2015   Procedure: Left Heart Cath and Coronary Angiography;  Surgeon: Peter M Martinique, MD;  Location: [place] [facility];  Service: Cardiovascular;  Laterality: N/A;  . CARDIAC CATHETERIZATION N/A 01/12/2015   Procedure: Left Heart Cath and Coronary Angiography;  Surgeon: Sherren Mocha, MD;  Location: [place] [facility];  Service: Cardiovascular;  Laterality: N/A;    There were no vitals filed for this visit.       Subjective Assessment - 04/04/16 1139    Subjective Patient has a long history of bilateral foot and ankle pain. she had more pain on the right but recently began having significant pain on the left. 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 She has increased pain when she stands for too long.    Limitations Sitting;Standing;Walking   How long can you sit comfortably? not limited    How long can you stand comfortably? > 20 min    How long can you walk comfortably? limited community distances    Diagnostic tests Nothing at this time    Patient Stated Goals To stand at work  without increased pain    Currently in Pain? Yes   Pain Score 6    Pain Location Ankle   Pain Orientation Left;Medial   Pain Descriptors / Indicators Aching   Pain Type Acute pain   Pain Onset More than a month ago   Pain Frequency Constant   Aggravating Factors  standing, walking    Pain Relieving Factors rest, ice    Effect of Pain on Daily Activities difficulty standing at work             [facility] PT Assessment - 04/04/16 0001      Assessment   Medical Diagnosis Left foot posterior tibialis tendinitis    Referring Provider Valerie Salts [facility]   Onset Date/Surgical Date --  > 3 months prior    Hand Dominance Right   Next MD Visit --  > 1 month      Precautions   Precautions None     Restrictions   Weight Bearing Restrictions No     Balance Screen   Has the patient fallen in the past 6 months No     [place] residence   Living Arrangements Spouse/significant other   Type of [place]     Prior Function   Level of [place] Full time employment   Training and development officer  Leisure Gardening, walking,      Cognition   Overall Cognitive Status Within Functional Limits for tasks assessed     Observation/Other Assessments   Observations Flat foot bilateral. Patient has tried custom orthotics in the past.    Focus on Therapeutic Outcomes (FOTO)  Ankle      Sensation   Additional Comments No numbness at this time      Coordination   Gross Motor Movements are Fluid and Coordinated No     Posture/Postural Control   Posture Comments Bilateral toe out      ROM / Strength   AROM / PROM / Strength AROM;PROM;Strength     AROM   Overall AROM Comments Active motion WNL      PROM   Overall PROM Comments Left DF motion to zero with straight dorsiflexion. With dorsiflexion with slight eversion she has 20 degrees. The DF with eversion is likley putting stress on her posterior tib tendon    PROM  Assessment Site Ankle   Right/Left Ankle Right;Left   Right Ankle Dorsiflexion 25   Right Ankle Plantar Flexion 55   Left Ankle Dorsiflexion 45   Left Ankle Inversion 36   Left Ankle Eversion 24  with pain      Strength   Strength Assessment Site Ankle   Right/Left Ankle Right;Left   Right Ankle Plantar Flexion 3/5   Right Ankle Inversion 3+/5   Right Ankle Eversion 3+/5   Left Ankle Dorsiflexion 4+/5   Left Ankle Plantar Flexion 3/5   Left Ankle Inversion 4/5   Left Ankle Eversion 4/5     Palpation   Palpation comment Significant tenderness to palpation in the medial arch around the navicular       Special Tests    Special Tests --  Anterior drawer test (-)      Ambulation/Gait   Gait Comments Supination into pronation with gait. In stance phase significant pronation of the left foot. She has tried several orthotic options and has not found anything that helps.[facility] Adult PT Treatment/Exercise - 04/04/16 0001      Manual Therapy   Manual therapy comments Anterior drawer mobilization Grade I and II for pain; Talor glide to improve DF movtion; Iontophoresis to anterior tib      Ankle Exercises: Stretches   Other Stretch DF stretch with cuing not to evert with dorsiflexion                   PT Short Term Goals - 04/04/16 1155      PT SHORT TERM GOAL #1   Title Patient will increase left single leg stance time to 15 seconds    Time 4   Period Weeks   Status New     PT SHORT TERM GOAL #2   Title Patient will increase gross left ankle strength to 4+/5    Time 4   Period Weeks   Status On-going     PT SHORT TERM GOAL #3   Title Patient will report 2/10 pain at worst   Time 4   Period Weeks   Status New     PT SHORT TERM GOAL #4   Title Patient will be independent wih initial HEP    Time 4   Period Weeks   Status New           PT Long Term Goals -  04/04/16 1157      PT LONG TERM GOAL #1   Title Patient will  stand for 2 hours at work without self report of pain    Time 8   Period Weeks   Status New     PT LONG TERM GOAL #2   Title Patient will ambulate i mile without report of pain in order to go shopping    Time 8   Period Weeks     PT LONG TERM GOAL #3   Title Patient will demostrate 5/5 gross left ankle strength in order to perfrom ADL's and IADL's    Time 8   Period Weeks   Status On-going               Plan - 04/04/16 1144    Clinical Impression Statement Patient is a 50 year old female with posterior tibialis pain on the left side. she has a history of pain on both sides. her pain increases when she stands. She has significant pronation with gait and standing. She has noraml dorsflexion when moved into slight eversion which is likely stressing her posterior tib. Therapy will work on manual therapy to improved talor movement and improve straight dorsflexion. She would also benefit from further stabilization activity. She was seen for a low complexity evaluation.    PT Frequency 2x / week   PT Duration 8 weeks   PT Treatment/Interventions ADLs/Self Care Home Management;Iontophoresis 4mg /ml Dexamethasone;Cryotherapy;Electrical Stimulation;Gait training;Stair training;Functional mobility training;Ultrasound;Therapeutic activities;Therapeutic exercise;Neuromuscular re-education;Patient/family education;Orthotic Fit/Training;Passive range of motion;Manual techniques;Dry needling   PT Next Visit Plan Patient not given exercises on the first visit. Consider t-band 4 way, seated heel raise, windshield wipers, light leg press; Toe slide. She has been here before but had difficulty coming 2nd to work. She is able to come eaisier this time. She would benefit from [facility] therapy to the osteriro tib area.    PT Home Exercise Plan Straight DF stretch   Consulted and Agree with Plan of Care Patient      Patient will benefit from skilled therapeutic intervention in order to improve the following  deficits and impairments:  Abnormal gait, Decreased mobility, Decreased strength, Pain, Decreased activity tolerance, Difficulty walking, Decreased range of motion  Visit Diagnosis: Pain in right ankle and joints of right foot  Pain in left ankle and joints of left foot  Muscle weakness (generalized)  Difficulty in walking, not elsewhere classified     Problem List Patient Active Problem List   Diagnosis Date Noted  . Near syncope 11/12/2015  . Ischemic cardiomyopathy 02/05/2015  . Hyperlipidemia 02/05/2015  . Essential hypertension 01/09/2015  . Coronary artery dissection   . NSTEMI (non-ST elevated myocardial infarction) ([place]) 01/08/2015    Carney Living PT DPT  04/04/2016, 12:02 PM  [facility] [address] [place], [place], [address] Phone: [phone]   Fax:  [phone]  Name: Samantha Lester MRN: [id number] Date of Birth: [birth year]

## 2016-04-07 ENCOUNTER — Ambulatory Visit: Payer: 59 | Attending: Student | Admitting: Physical Therapy

## 2016-04-07 DIAGNOSIS — M25571 Pain in right ankle and joints of right foot: Secondary | ICD-10-CM | POA: Diagnosis not present

## 2016-04-07 DIAGNOSIS — M5442 Lumbago with sciatica, left side: Secondary | ICD-10-CM | POA: Insufficient documentation

## 2016-04-07 DIAGNOSIS — R262 Difficulty in walking, not elsewhere classified: Secondary | ICD-10-CM | POA: Insufficient documentation

## 2016-04-07 DIAGNOSIS — M25572 Pain in left ankle and joints of left foot: Secondary | ICD-10-CM | POA: Insufficient documentation

## 2016-04-07 DIAGNOSIS — D123 Benign neoplasm of transverse colon: Secondary | ICD-10-CM | POA: Diagnosis not present

## 2016-04-07 DIAGNOSIS — M6281 Muscle weakness (generalized): Secondary | ICD-10-CM | POA: Insufficient documentation

## 2016-04-07 DIAGNOSIS — M62830 Muscle spasm of back: Secondary | ICD-10-CM | POA: Insufficient documentation

## 2016-04-08 NOTE — Therapy (Signed)
[place], [place], [address] Phone: [phone]   Fax:  [phone]  Physical Therapy Treatment  Patient Details  Name: Samantha Lester MRN: [id number] Date of Birth: Jul 07, 1966 Referring Provider: Valerie Salts [facility]  Encounter Date: 04/07/2016      PT End of Session - 04/07/16 1456    Visit Number 2   Number of Visits 16   Date for PT Re-Evaluation 05/29/16   Authorization Type [employer]    PT Start Time 1100   PT Stop Time 1144   PT Time Calculation (min) 44 min   Activity Tolerance Patient tolerated treatment well   Behavior During Therapy [facility] for tasks assessed/performed      Past Medical History:  Diagnosis Date  . Coronary artery dissection   . GERD (gastroesophageal reflux disease)   . Hyperlipidemia   . Hypertension     Past Surgical History:  Procedure Laterality Date  . CARDIAC CATHETERIZATION N/A 01/09/2015   Procedure: Left Heart Cath and Coronary Angiography;  Surgeon: Peter M Martinique, MD;  Location: [place] [facility];  Service: Cardiovascular;  Laterality: N/A;  . CARDIAC CATHETERIZATION N/A 01/12/2015   Procedure: Left Heart Cath and Coronary Angiography;  Surgeon: Sherren Mocha, MD;  Location: [place] [facility];  Service: Cardiovascular;  Laterality: N/A;    There were no vitals filed for this visit.      Subjective Assessment - 04/07/16 1453    Subjective Patient reports she was sore at work over the weekedn. She had increasing soreness as the day went on. She is still sore this morning but not as bad.    Limitations Sitting;Standing;Walking   How long can you sit comfortably? not limited    How long can you stand comfortably? > 20 min    How long can you walk comfortably? limited community distances    Diagnostic tests Nothing at this time    Patient Stated Goals To stand at work without increased pain    Currently in Pain? Yes   Pain Score 6    Pain Location Ankle   Pain  Orientation Left;Medial   Pain Descriptors / Indicators Aching   Pain Type Acute pain   Pain Onset More than a month ago   Pain Frequency Constant   Aggravating Factors  stnading, walking    Pain Relieving Factors rest, ice    Effect of Pain on Daily Activities difficulty standing at work                          [facility] Adult PT Treatment/Exercise - 04/08/16 0001      Manual Therapy   Manual therapy comments Anterior drawer mobilization Grade I and II for pain; Talor glide to improve DF movtion; Iontophoresis to anterior tib      Ankle Exercises: Standing   Other Standing Ankle Exercises Standing march 2x10                 PT Education - 04/07/16 1456    Education provided Yes   Education Details importance of increasing stability    Person(s) Educated Patient   Methods Explanation   Comprehension Verbalized understanding          PT Short Term Goals - 04/07/16 1459      PT SHORT TERM GOAL #1   Title Patient will increase left single leg stance time to 15 seconds    Baseline unable at this time  Period Weeks     PT SHORT TERM GOAL #2   Title Patient will increase gross left ankle strength to 4+/5    Baseline can do some   Time 4   Period Weeks   Status On-going     PT SHORT TERM GOAL #3   Title Patient will report 2/10 pain at worst   Baseline leg heavy the next day after walking 1/2 mile the day before   Time 4   Period Weeks   Status On-going     PT SHORT TERM GOAL #4   Title Patient will be independent wih initial HEP    Time 4   Period Weeks   Status On-going           PT Long Term Goals - 04/04/16 1157      PT LONG TERM GOAL #1   Title Patient will stand for 2 hours at work without self report of pain    Time 8   Period Weeks   Status New     PT LONG TERM GOAL #2   Title Patient will ambulate i mile without report of pain in order to go shopping    Time 8   Period Weeks     PT LONG TERM GOAL #3   Title Patient  will demostrate 5/5 gross left ankle strength in order to perfrom ADL's and IADL's    Time 8   Period Weeks   Status On-going               Plan - 04/07/16 1457    Clinical Impression Statement Patient tolerated treatment well. She had some soreness with exercise but it was tolerabel. Therapy workewd on IASTM to improve healing of the tendon. She was able to perfrom standing exercises.    Rehab Potential Good   PT Treatment/Interventions ADLs/Self Care Home Management;Iontophoresis 4mg /ml Dexamethasone;Cryotherapy;Electrical Stimulation;Gait training;Stair training;Functional mobility training;Ultrasound;Therapeutic activities;Therapeutic exercise;Neuromuscular re-education;Patient/family education;Orthotic Fit/Training;Passive range of motion;Manual techniques;Dry needling   PT Next Visit Plan Consider t-band 4 way, seated heel raise, windshield wipers, light leg press; Toe slide. She has been here before but had difficulty coming 2nd to work. She is able to come eaisier this time. She would benefit from [facility] therapy to the posteriro tib area. Consider ultrasound    PT Home Exercise Plan Straight DF stretch   Consulted and Agree with Plan of Care Patient      Patient will benefit from skilled therapeutic intervention in order to improve the following deficits and impairments:     Visit Diagnosis: Pain in left ankle and joints of left foot  Muscle weakness (generalized)  Difficulty in walking, not elsewhere classified  Pain in right ankle and joints of right foot     Problem List Patient Active Problem List   Diagnosis Date Noted  . Near syncope 11/12/2015  . Ischemic cardiomyopathy 02/05/2015  . Hyperlipidemia 02/05/2015  . Essential hypertension 01/09/2015  . Coronary artery dissection   . NSTEMI (non-ST elevated myocardial infarction) ([place]) 01/08/2015    Carney Living  PT DPT  04/08/2016, 7:45 AM  [facility] [address] [place], [place], [address] Phone: [phone]   Fax:  [phone]  Name: Samantha Lester MRN: [id number] Date of Birth: 1966-02-11

## 2016-04-09 ENCOUNTER — Ambulatory Visit: Payer: 59 | Admitting: Physical Therapy

## 2016-04-09 DIAGNOSIS — M25572 Pain in left ankle and joints of left foot: Secondary | ICD-10-CM | POA: Diagnosis not present

## 2016-04-09 DIAGNOSIS — M25571 Pain in right ankle and joints of right foot: Secondary | ICD-10-CM | POA: Diagnosis not present

## 2016-04-09 DIAGNOSIS — M62830 Muscle spasm of back: Secondary | ICD-10-CM | POA: Diagnosis not present

## 2016-04-09 DIAGNOSIS — M6281 Muscle weakness (generalized): Secondary | ICD-10-CM

## 2016-04-09 DIAGNOSIS — R262 Difficulty in walking, not elsewhere classified: Secondary | ICD-10-CM

## 2016-04-09 DIAGNOSIS — D123 Benign neoplasm of transverse colon: Secondary | ICD-10-CM | POA: Diagnosis not present

## 2016-04-09 DIAGNOSIS — M5442 Lumbago with sciatica, left side: Secondary | ICD-10-CM | POA: Diagnosis not present

## 2016-04-09 NOTE — Therapy (Signed)
[place], [place], [address] Phone: [phone]   Fax:  [phone]  Physical Therapy Treatment  Patient Details  Name: Samantha Lester MRN: [id number] Date of Birth: Nov 13, 1965 Referring Provider: Valerie Salts [facility]  Encounter Date: 04/09/2016      PT End of Session - 04/09/16 1005    Visit Number 3   Number of Visits 16   Date for PT Re-Evaluation 05/29/16   Authorization Type [employer]    PT Start Time 0835   PT Stop Time 0915   PT Time Calculation (min) 40 min   Activity Tolerance Patient tolerated treatment well   Behavior During Therapy [facility] for tasks assessed/performed      Past Medical History:  Diagnosis Date  . Coronary artery dissection   . GERD (gastroesophageal reflux disease)   . Hyperlipidemia   . Hypertension     Past Surgical History:  Procedure Laterality Date  . CARDIAC CATHETERIZATION N/A 01/09/2015   Procedure: Left Heart Cath and Coronary Angiography;  Surgeon: Peter M Martinique, MD;  Location: [place] [facility];  Service: Cardiovascular;  Laterality: N/A;  . CARDIAC CATHETERIZATION N/A 01/12/2015   Procedure: Left Heart Cath and Coronary Angiography;  Surgeon: Sherren Mocha, MD;  Location: [place] [facility];  Service: Cardiovascular;  Laterality: N/A;    There were no vitals filed for this visit.      Subjective Assessment - 04/09/16 1000    Subjective Patient reports improved pain.  She has less pain with palpation. She has been standing and walking with less pain. She has been doing her exercises.    Limitations Sitting;Standing;Walking   How long can you sit comfortably? not limited    How long can you stand comfortably? > 20 min    How long can you walk comfortably? limited community distances    Diagnostic tests Nothing at this time    Patient Stated Goals To stand at work without increased pain    Currently in Pain? Yes   Pain Score 3    Pain Location Ankle   Pain  Orientation Left;Right   Pain Descriptors / Indicators Aching   Pain Type Acute pain   Pain Onset More than a month ago   Pain Frequency Constant   Aggravating Factors  standing and walking    Pain Relieving Factors rest and ice    Effect of Pain on Daily Activities diffciulty standing at work                          [facility] Adult PT Treatment/Exercise - 04/09/16 0001      Ankle Exercises: Standing   Other Standing Ankle Exercises Standing march 2x10    Other Standing Ankle Exercises step onto air-ex forward and lateral x10, mini squats  2x10;      Ankle Exercises: Supine   Other Supine Ankle Exercises t-band 4 way with red                 PT Education - 04/09/16 1003    Education provided Yes   Education Details continued progression of stability activity.    Person(s) Educated Patient   Methods Explanation;Demonstration;Handout   Comprehension Verbalized understanding;Verbal cues required          PT Short Term Goals - 04/09/16 1009      PT SHORT TERM GOAL #1   Title (P)  Patient will increase left single leg stance time to  15 seconds    Baseline (P)  unable at this time    Time (P)  4   Period (P)  Weeks   Status (P)  On-going     PT SHORT TERM GOAL #2   Title (P)  Patient will increase gross left ankle strength to 4+/5    Baseline (P)  can do some   Time (P)  4   Period (P)  Weeks   Status (P)  On-going     PT SHORT TERM GOAL #3   Title (P)  Patient will report 2/10 pain at worst   Baseline (P)  leg heavy the next day after walking 1/2 mile the day before   Time (P)  4   Period (P)  Weeks   Status (P)  On-going     PT SHORT TERM GOAL #4   Title (P)  Patient will be independent wih initial HEP    Time (P)  4   Period (P)  Weeks   Status (P)  On-going           PT Long Term Goals - 04/04/16 1157      PT LONG TERM GOAL #1   Title Patient will stand for 2 hours at work without self report of pain    Time 8   Period Weeks    Status New     PT LONG TERM GOAL #2   Title Patient will ambulate i mile without report of pain in order to go shopping    Time 8   Period Weeks     PT LONG TERM GOAL #3   Title Patient will demostrate 5/5 gross left ankle strength in order to perfrom ADL's and IADL's    Time 8   Period Weeks   Status On-going               Plan - 04/09/16 1008    Clinical Impression Statement Patient is making good progress. he rpain was down today. Therapy advanced her band to red band and added furter standing activity. Patient also perfromed functional squat.    PT Frequency 2x / week   PT Duration 8 weeks   PT Treatment/Interventions ADLs/Self Care Home Management;Iontophoresis 4mg /ml Dexamethasone;Cryotherapy;Electrical Stimulation;Gait training;Stair training;Functional mobility training;Ultrasound;Therapeutic activities;Therapeutic exercise;Neuromuscular re-education;Patient/family education;Orthotic Fit/Training;Passive range of motion;Manual techniques;Dry needling   PT Next Visit Plan Consider t-band 4 way, seated heel raise, windshield wipers, light leg press; Toe slide. She has been here before but had difficulty coming 2nd to work. She is able to come eaisier this time. She would benefit from [facility] therapy to the posteriro tib area. Consider ultrasound    PT Home Exercise Plan Straight DF stretch   Consulted and Agree with Plan of Care Patient      Patient will benefit from skilled therapeutic intervention in order to improve the following deficits and impairments:  Abnormal gait, Decreased mobility, Decreased strength, Pain, Decreased activity tolerance, Difficulty walking, Decreased range of motion  Visit Diagnosis: Pain in left ankle and joints of left foot  Muscle weakness (generalized)  Difficulty in walking, not elsewhere classified  Pain in right ankle and joints of right foot     Problem List Patient Active Problem List   Diagnosis Date Noted  . Near syncope  11/12/2015  . Ischemic cardiomyopathy 02/05/2015  . Hyperlipidemia 02/05/2015  . Essential hypertension 01/09/2015  . Coronary artery dissection   . NSTEMI (non-ST elevated myocardial infarction) ([place]) 01/08/2015    Rachael Darby DPT  04/09/2016, 11:38 AM  [place], [place], [address] Phone: [phone]   Fax:  [phone]  Name: Aleita Bruning MRN: [id number] Date of Birth: 01-Oct-1965

## 2016-04-14 ENCOUNTER — Ambulatory Visit: Payer: 59 | Admitting: Physical Therapy

## 2016-04-14 DIAGNOSIS — D123 Benign neoplasm of transverse colon: Secondary | ICD-10-CM | POA: Diagnosis not present

## 2016-04-14 DIAGNOSIS — M62830 Muscle spasm of back: Secondary | ICD-10-CM | POA: Diagnosis not present

## 2016-04-14 DIAGNOSIS — M25572 Pain in left ankle and joints of left foot: Secondary | ICD-10-CM | POA: Diagnosis not present

## 2016-04-14 DIAGNOSIS — M6281 Muscle weakness (generalized): Secondary | ICD-10-CM | POA: Diagnosis not present

## 2016-04-14 DIAGNOSIS — R262 Difficulty in walking, not elsewhere classified: Secondary | ICD-10-CM

## 2016-04-14 DIAGNOSIS — M25571 Pain in right ankle and joints of right foot: Secondary | ICD-10-CM | POA: Diagnosis not present

## 2016-04-14 DIAGNOSIS — M5442 Lumbago with sciatica, left side: Secondary | ICD-10-CM | POA: Diagnosis not present

## 2016-04-14 NOTE — Therapy (Signed)
[name], [place], [address] Phone: [phone]   Fax:  [phone]  Physical Therapy Treatment  Patient Details  Name: Samantha Lester MRN: [id number] Date of Birth: Jun 07, 1966 Referring Provider: Valerie Salts [facility]  Encounter Date: 04/14/2016      PT End of Session - 04/14/16 0909    Visit Number 4   Number of Visits 16   Date for PT Re-Evaluation 05/29/16   Authorization Type [employer]    PT Start Time [phone]   PT Stop Time 0930   PT Time Calculation (min) 44 min   Activity Tolerance Patient tolerated treatment well   Behavior During Therapy [facility] for tasks assessed/performed      Past Medical History:  Diagnosis Date  . Coronary artery dissection   . GERD (gastroesophageal reflux disease)   . Hyperlipidemia   . Hypertension     Past Surgical History:  Procedure Laterality Date  . CARDIAC CATHETERIZATION N/A 01/09/2015   Procedure: Left Heart Cath and Coronary Angiography;  Surgeon: Peter M Martinique, MD;  Location: [place] [facility];  Service: Cardiovascular;  Laterality: N/A;  . CARDIAC CATHETERIZATION N/A 01/12/2015   Procedure: Left Heart Cath and Coronary Angiography;  Surgeon: Sherren Mocha, MD;  Location: [place] [facility];  Service: Cardiovascular;  Laterality: N/A;    There were no vitals filed for this visit.      Subjective Assessment - 04/14/16 0851    Subjective Patient worked over [employer] weekend. She reported increasing pain throughout the day in the middle of both of her feet. She did not have much pain in the medial area of her foot. Most of her pain was in the middle and on both sides. She is also having tingling in her left leg. she will see the back doctor next week.    Limitations Sitting;Standing;Walking   How long can you sit comfortably? not limited    How long can you stand comfortably? > 20 min    How long can you walk comfortably? limited community distances    Diagnostic tests  Nothing at this time    Patient Stated Goals To stand at work without increased pain    Currently in Pain? Yes   Pain Score 2    Pain Location Ankle   Pain Orientation Left;Medial   Pain Descriptors / Indicators Aching   Pain Type Acute pain   Pain Onset More than a month ago   Pain Frequency Constant   Aggravating Factors  standing and walking   Pain Relieving Factors rest and ice    Effect of Pain on Daily Activities difficulty standing                          [facility] Adult PT Treatment/Exercise - 04/14/16 0001      Knee/Hip Exercises: Machines for Strengthening   Other Machine heel raise 40lb 2x10 no pain or tingling      Modalities   Modalities Iontophoresis     Iontophoresis   Type of Iontophoresis Dexamethasone   Location L ankle    Dose 1cc    Time 4-6 hour patch     Manual Therapy   Manual therapy comments Anterior drawer mobilization Grade I and II for pain; Talor glide to improve DF movtion; Iontophoresis to anterior tib      Ankle Exercises: Standing   Other Standing Ankle Exercises Standing march 2x10, Heel raise x8 ( stopped 2nd to  report of tingling.    Other Standing Ankle Exercises step onto air-ex forward and lateral 2 x10, mini squats  2x10; Step up 6' step 2x10      Ankle Exercises: Supine   Other Supine Ankle Exercises t-band 4 way with red                 PT Education - 04/14/16 0909    Education provided Yes   Education Details importance of having arch support    Person(s) Educated Patient   Methods Explanation;Demonstration   Comprehension Verbalized understanding;Returned demonstration          PT Short Term Goals - 04/14/16 0917      PT SHORT TERM GOAL #1   Title Patient will increase left single leg stance time to 15 seconds    Baseline unable at this time    Time 4   Period Weeks   Status On-going     PT SHORT TERM GOAL #2   Title Patient will increase gross left ankle strength to 4+/5    Baseline can  do some   Time 4   Period Weeks   Status On-going     PT SHORT TERM GOAL #3   Title Patient will report 2/10 pain at worst   Baseline leg heavy the next day after walking 1/2 mile the day before   Time 4   Period Weeks   Status On-going     PT SHORT TERM GOAL #4   Title Patient will be independent wih initial HEP    Time 4   Period Weeks   Status On-going           PT Long Term Goals - 04/04/16 1157      PT LONG TERM GOAL #1   Title Patient will stand for 2 hours at work without self report of pain    Time 8   Period Weeks   Status New     PT LONG TERM GOAL #2   Title Patient will ambulate i mile without report of pain in order to go shopping    Time 8   Period Weeks     PT LONG TERM GOAL #3   Title Patient will demostrate 5/5 gross left ankle strength in order to perfrom ADL's and IADL's    Time 8   Period Weeks   Status On-going               Plan - 04/14/16 0911    Clinical Impression Statement Patient tolerated strengthening and soft tissue work well. She had reporduction of pain with air-ex exercises. No new goals met today. Patient also perfromed leg press and heel raise on the legf press. She had no increase in pain. Therapy educated the patient on the improtance of increasing stability.    PT Frequency 2x / week   PT Duration 8 weeks   PT Treatment/Interventions ADLs/Self Care Home Management;Iontophoresis 31m/ml Dexamethasone;Cryotherapy;Electrical Stimulation;Gait training;Stair training;Functional mobility training;Ultrasound;Therapeutic activities;Therapeutic exercise;Neuromuscular re-education;Patient/family education;Orthotic Fit/Training;Passive range of motion;Manual techniques;Dry needling   PT Next Visit Plan continue with strengthening, and IASTYM. Assess tolerance to new exercises.    Consulted and Agree with Plan of Care Patient      Patient will benefit from skilled therapeutic intervention in order to improve the following deficits  and impairments:  Abnormal gait, Decreased mobility, Decreased strength, Pain, Decreased activity tolerance, Difficulty walking, Decreased range of motion  Visit Diagnosis: Pain in left ankle and joints of left foot  Muscle weakness (generalized)  Difficulty in walking, not elsewhere classified     Problem List Patient Active Problem List   Diagnosis Date Noted  . Near syncope 11/12/2015  . Ischemic cardiomyopathy 02/05/2015  . Hyperlipidemia 02/05/2015  . Essential hypertension 01/09/2015  . Coronary artery dissection   . NSTEMI (non-ST elevated myocardial infarction) ([place]) 01/08/2015    Carney Living PT DPT  04/14/2016, 1:22 PM  [facility] [address] [place], [place], [address] Phone: [phone]   Fax:  [phone]  Name: Samantha Lester MRN: [id number] Date of Birth: 1966-01-19

## 2016-04-15 DIAGNOSIS — M5432 Sciatica, left side: Secondary | ICD-10-CM | POA: Diagnosis not present

## 2016-04-15 DIAGNOSIS — M545 Low back pain: Secondary | ICD-10-CM | POA: Diagnosis not present

## 2016-04-15 MED FILL — predniSONE 5 MG (21) TBPK: 5 | 6 days supply | Qty: 21 | Fill #0

## 2016-04-16 ENCOUNTER — Ambulatory Visit: Payer: 59 | Admitting: Physical Therapy

## 2016-04-16 DIAGNOSIS — M6281 Muscle weakness (generalized): Secondary | ICD-10-CM | POA: Diagnosis not present

## 2016-04-16 DIAGNOSIS — M25572 Pain in left ankle and joints of left foot: Secondary | ICD-10-CM

## 2016-04-16 DIAGNOSIS — R262 Difficulty in walking, not elsewhere classified: Secondary | ICD-10-CM

## 2016-04-16 DIAGNOSIS — M25571 Pain in right ankle and joints of right foot: Secondary | ICD-10-CM | POA: Diagnosis not present

## 2016-04-16 DIAGNOSIS — M62830 Muscle spasm of back: Secondary | ICD-10-CM | POA: Diagnosis not present

## 2016-04-16 DIAGNOSIS — D123 Benign neoplasm of transverse colon: Secondary | ICD-10-CM | POA: Diagnosis not present

## 2016-04-16 DIAGNOSIS — M5442 Lumbago with sciatica, left side: Secondary | ICD-10-CM | POA: Diagnosis not present

## 2016-04-16 MED FILL — CONTRAVE ER 8-90 MG TABLET: 8-90 | 30 days supply | Qty: 120 | Fill #0

## 2016-04-16 NOTE — Therapy (Signed)
[place], [place], [address] Phone: [phone]   Fax:  [phone]  Physical Therapy Treatment  Patient Details  Name: Samantha Lester MRN: [id number] Date of Birth: May 08, 1966 Referring Provider: Valerie Salts [facility]  Encounter Date: 04/16/2016      PT End of Session - 04/16/16 0922    Visit Number 5   Number of Visits 16   Date for PT Re-Evaluation 05/29/16   Authorization Type [employer]    PT Start Time 0850   PT Stop Time 0940   PT Time Calculation (min) 50 min   Activity Tolerance Patient tolerated treatment well   Behavior During Therapy [facility]: [facility] Rhonda W for tasks assessed/performed      Past Medical History:  Diagnosis Date  . Coronary artery dissection   . GERD (gastroesophageal reflux disease)   . Hyperlipidemia   . Hypertension     Past Surgical History:  Procedure Laterality Date  . CARDIAC CATHETERIZATION N/A 01/09/2015   Procedure: Left Heart Cath and Coronary Angiography;  Surgeon: Peter M Martinique, MD;  Location: [place] [facility];  Service: Cardiovascular;  Laterality: N/A;  . CARDIAC CATHETERIZATION N/A 01/12/2015   Procedure: Left Heart Cath and Coronary Angiography;  Surgeon: Sherren Mocha, MD;  Location: [place] [facility];  Service: Cardiovascular;  Laterality: N/A;    There were no vitals filed for this visit.      Subjective Assessment - 04/16/16 0913    Subjective Patient went to the MD who is concerned she has a buldging disc. She will have an MRI on her back. she is also going on prednisone. She has been having less pain in her foot but she has not been working.    Limitations Sitting;Standing;Walking   How long can you sit comfortably? not limited    How long can you stand comfortably? > 20 min    How long can you walk comfortably? limited community distances    Diagnostic tests Nothing at this time    Patient Stated Goals To stand at work without increased pain    Currently in Pain?  No/denies                         [facility] Adult PT Treatment/Exercise - 04/16/16 0001      Knee/Hip Exercises: Machines for Strengthening   Other Machine heel raise 40lb 2x10 no pain or tingling      Modalities   Modalities Iontophoresis     Iontophoresis   Type of Iontophoresis Dexamethasone   Location L ankle    Dose 1cc    Time 4-6 hour patch     Manual Therapy   Manual therapy comments Anterior drawer mobilization Grade I and II for pain; Talor glide to improve DF movtion; IASTYM to posterior tib tendon       Ankle Exercises: Standing   Other Standing Ankle Exercises Standing march 2x10, Heel raise x8 ( stopped 2nd to report of tingling. Mini squats 2x10     Other Standing Ankle Exercises step onto air-ex forward and lateral 2 x10, mini squats  2x10; Step up 6' step 2x10                 PT Education - 04/16/16 0922    Education provided Yes   Education Details continue to work on [employer]) Educated Patient   Methods Explanation;Demonstration   Comprehension Verbalized understanding;Returned demonstration  PT Short Term Goals - 04/16/16 1557      PT SHORT TERM GOAL #1   Title Patient will increase left single leg stance time to 15 seconds    Baseline 5 seconds with bilateral UE support    Time 4   Period Weeks   Status On-going     PT SHORT TERM GOAL #2   Title Patient will increase gross left ankle strength to 4+/5    Baseline IV/ EV 4/5    Time 4   Period Weeks   Status On-going     PT SHORT TERM GOAL #3   Title Patient will report 2/10 pain at worst   Baseline leg heavy the next day after walking 1/2 mile the day before   Time 4   Period Weeks   Status On-going     PT SHORT TERM GOAL #4   Title Patient will be independent wih initial HEP    Time 4   Period Weeks   Status Achieved           PT Long Term Goals - 04/04/16 1157      PT LONG TERM GOAL #1   Title Patient will stand for 2 hours at  work without self report of pain    Time 8   Period Weeks   Status New     PT LONG TERM GOAL #2   Title Patient will ambulate i mile without report of pain in order to go shopping    Time 8   Period Weeks     PT LONG TERM GOAL #3   Title Patient will demostrate 5/5 gross left ankle strength in order to perfrom ADL's and IADL's    Time 8   Period Weeks   Status On-going               Plan - 04/16/16 0925    Clinical Impression Statement Patient is making good progress as far as her tendonitis goes. Her pain increases when she stands with her camera. Stretching her hamstring reproduces the bottom of her foot pain. She was given a hamstring stretch. Continue with ionto for 1-2 more visits. She is no longer having significant pain along her posteriro tib tendon. She continues to have pain in the middle of her feet.    Rehab Potential Good   PT Frequency 2x / week   PT Duration 12 weeks   PT Treatment/Interventions ADLs/Self Care Home Management;Iontophoresis 4mg /ml Dexamethasone;Cryotherapy;Electrical Stimulation;Gait training;Stair training;Functional mobility training;Ultrasound;Therapeutic activities;Therapeutic exercise;Neuromuscular re-education;Patient/family education;Orthotic Fit/Training;Passive range of motion;Manual techniques;Dry needling   PT Next Visit Plan continue with strengthening, and IASTYM. Assess tolerance to new exercises.    PT Home Exercise Plan Straight DF stretch   Consulted and Agree with Plan of Care Patient      Patient will benefit from skilled therapeutic intervention in order to improve the following deficits and impairments:  Abnormal gait, Decreased mobility, Decreased strength, Pain, Decreased activity tolerance, Difficulty walking, Decreased range of motion  Visit Diagnosis: Pain in left ankle and joints of left foot  Muscle weakness (generalized)  Difficulty in walking, not elsewhere classified  Pain in right ankle and joints of right  foot     Problem List Patient Active Problem List   Diagnosis Date Noted  . Near syncope 11/12/2015  . Ischemic cardiomyopathy 02/05/2015  . Hyperlipidemia 02/05/2015  . Essential hypertension 01/09/2015  . Coronary artery dissection   . NSTEMI (non-ST elevated myocardial infarction) ([place]) 01/08/2015    Shanon Brow  Robb Matar PT DPT  04/16/2016, 4:02 PM  [place] [facility] [address] [place], [place], [address] Phone: [phone]   Fax:  [phone]  Name: Samantha Lester MRN: [id number] Date of Birth: [birth year]

## 2016-04-22 ENCOUNTER — Ambulatory Visit: Payer: 59 | Admitting: Physical Therapy

## 2016-04-22 DIAGNOSIS — M5442 Lumbago with sciatica, left side: Secondary | ICD-10-CM | POA: Diagnosis not present

## 2016-04-22 DIAGNOSIS — M62830 Muscle spasm of back: Secondary | ICD-10-CM | POA: Diagnosis not present

## 2016-04-22 DIAGNOSIS — D123 Benign neoplasm of transverse colon: Secondary | ICD-10-CM | POA: Diagnosis not present

## 2016-04-22 DIAGNOSIS — M25571 Pain in right ankle and joints of right foot: Secondary | ICD-10-CM | POA: Diagnosis not present

## 2016-04-22 DIAGNOSIS — R262 Difficulty in walking, not elsewhere classified: Secondary | ICD-10-CM

## 2016-04-22 DIAGNOSIS — M25572 Pain in left ankle and joints of left foot: Secondary | ICD-10-CM

## 2016-04-22 DIAGNOSIS — M6281 Muscle weakness (generalized): Secondary | ICD-10-CM

## 2016-04-22 MED FILL — LISINOPRIL 20 MG TABLET: 20 | 90 days supply | Qty: 90 | Fill #0

## 2016-04-23 NOTE — Therapy (Signed)
[place], [place], [address] Phone: [phone]   Fax:  [phone]  Physical Therapy Treatment  Patient Details  Name: Samantha Lester MRN: [id number] Date of Birth: [birth year] Referring Provider: Valerie Salts [facility]  Encounter Date: 04/22/2016      PT End of Session - 04/23/16 0820    Visit Number 6   Number of Visits 16   Date for PT Re-Evaluation 05/29/16   Authorization Type [employer]    PT Start Time 0847   PT Stop Time 0930   PT Time Calculation (min) 43 min   Activity Tolerance Patient tolerated treatment well   Behavior During Therapy [facility] for tasks assessed/performed      Past Medical History:  Diagnosis Date  . Coronary artery dissection   . GERD (gastroesophageal reflux disease)   . Hyperlipidemia   . Hypertension     Past Surgical History:  Procedure Laterality Date  . CARDIAC CATHETERIZATION N/A 01/09/2015   Procedure: Left Heart Cath and Coronary Angiography;  Surgeon: Peter M Martinique, MD;  Location: [facility];  Service: Cardiovascular;  Laterality: N/A;  . CARDIAC CATHETERIZATION N/A 01/12/2015   Procedure: Left Heart Cath and Coronary Angiography;  Surgeon: Sherren Mocha, MD;  Location: [facility];  Service: Cardiovascular;  Laterality: N/A;    There were no vitals filed for this visit.      Subjective Assessment - 04/22/16 0917    Subjective Patient had pain on Saturday when she was busy at work. She flet like her whole foot hurt on the left side. her right foot was nit as bad. She did not have a busyy day on Sunday and her pain was better.    Limitations Sitting;Walking   How long can you sit comfortably? not limited    How long can you stand comfortably? > 20 min    How long can you walk comfortably? limited community distances    Diagnostic tests Nothing at this time    Patient Stated Goals To stand at work without increased pain    Currently in Pain? Yes   Pain  Score 2    Pain Location Ankle   Pain Orientation Left;Right   Pain Descriptors / Indicators Aching   Pain Type Acute pain   Pain Onset More than a month ago   Pain Frequency Constant   Aggravating Factors  standing and walking    Pain Relieving Factors rest and ice    Effect of Pain on Daily Activities difficulty standing                          [facility] Adult PT Treatment/Exercise - 04/23/16 0001      Lumbar Exercises: Supine   Clam Limitations 2x10    Bridge Limitations 2x10   Straight Leg Raises Limitations 1x10      Knee/Hip Exercises: Machines for Strengthening   Other Machine heel raise 40lb 2x10 increase in bilateral foot pain in her arches      Manual Therapy   Manual therapy comments Anterior drawer mobilization Grade I and II for pain; Talor glide to improve DF movtion; IASTYM to posterior tib tendon       Ankle Exercises: Supine   Other Supine Ankle Exercises t-band 4 way with green                PT Education - 04/22/16 0919    Education provided Yes  Education Details improtance of whole leg strengthening    Person(s) Educated Patient   Methods Explanation;Demonstration   Comprehension Verbalized understanding;Returned demonstration          PT Short Term Goals - 04/23/16 0828      PT SHORT TERM GOAL #1   Title Patient will increase left single leg stance time to 15 seconds    Baseline 5 seconds with bilateral UE support    Time 4   Period Weeks   Status On-going     PT SHORT TERM GOAL #2   Title Patient will increase gross left ankle strength to 4+/5    Baseline IV/ EV 4/5    Time 4   Period Weeks   Status On-going     PT SHORT TERM GOAL #3   Title Patient will report 2/10 pain at worst   Baseline leg heavy the next day after walking 1/2 mile the day before   Time 4   Period Weeks   Status On-going     PT SHORT TERM GOAL #4   Title Patient will be independent wih initial HEP    Time 4   Period Weeks   Status  Achieved           PT Long Term Goals - 04/04/16 1157      PT LONG TERM GOAL #1   Title Patient will stand for 2 hours at work without self report of pain    Time 8   Period Weeks   Status New     PT LONG TERM GOAL #2   Title Patient will ambulate i mile without report of pain in order to go shopping    Time 8   Period Weeks     PT LONG TERM GOAL #3   Title Patient will demostrate 5/5 gross left ankle strength in order to perfrom ADL's and IADL's    Time 8   Period Weeks   Status On-going               Plan - 04/23/16 [id number]    Clinical Impression Statement Therapy worked on general hip and knee strengthening. She had reporduction of her back and foot symptoms. She also had reproduction of her foot symptoms with heel raises on the cybex machine. Therapy will assess carryover over the next few dayds. If her pain does not last therapy will continue to strengthen.  Therapy held on ionto today. She has had 6 treatments and her posterior tib tendon is not as sore.    Rehab Potential Good   PT Frequency 2x / week   PT Duration 12 weeks   PT Treatment/Interventions ADLs/Self Care Home Management;Iontophoresis 4mg /ml Dexamethasone;Cryotherapy;Electrical Stimulation;Gait training;Stair training;Functional mobility training;Ultrasound;Therapeutic activities;Therapeutic exercise;Neuromuscular re-education;Patient/family education;Orthotic Fit/Training;Passive range of motion;Manual techniques;Dry needling   PT Next Visit Plan continue with strengthening, and IASTYM. Assess tolerance to new exercises.    PT Home Exercise Plan Straight DF stretch   Consulted and Agree with Plan of Care Patient      Patient will benefit from skilled therapeutic intervention in order to improve the following deficits and impairments:  Abnormal gait, Decreased mobility, Decreased strength, Pain, Decreased activity tolerance, Difficulty walking, Decreased range of motion  Visit Diagnosis: Pain in left  ankle and joints of left foot  Muscle weakness (generalized)  Difficulty in walking, not elsewhere classified  Pain in right ankle and joints of right foot     Problem List Patient Active Problem List   Diagnosis Date Noted  .  Near syncope 11/12/2015  . Ischemic cardiomyopathy 02/05/2015  . Hyperlipidemia 02/05/2015  . Essential hypertension 01/09/2015  . Coronary artery dissection   . NSTEMI (non-ST elevated myocardial infarction) ([place]) 01/08/2015    Carney Living PT DPT  04/23/2016, 8:30 AM  [facility] [address] [place], [place], [address] Phone: [phone]   Fax:  [phone]  Name: Samantha Lester MRN: [id number] Date of Birth: [birth year]

## 2016-04-24 ENCOUNTER — Ambulatory Visit: Payer: 59 | Admitting: Physical Therapy

## 2016-04-24 DIAGNOSIS — M6281 Muscle weakness (generalized): Secondary | ICD-10-CM | POA: Diagnosis not present

## 2016-04-24 DIAGNOSIS — M25572 Pain in left ankle and joints of left foot: Secondary | ICD-10-CM | POA: Diagnosis not present

## 2016-04-24 DIAGNOSIS — R262 Difficulty in walking, not elsewhere classified: Secondary | ICD-10-CM

## 2016-04-24 DIAGNOSIS — M25571 Pain in right ankle and joints of right foot: Secondary | ICD-10-CM | POA: Diagnosis not present

## 2016-04-24 DIAGNOSIS — M62830 Muscle spasm of back: Secondary | ICD-10-CM | POA: Diagnosis not present

## 2016-04-24 DIAGNOSIS — M5442 Lumbago with sciatica, left side: Secondary | ICD-10-CM | POA: Diagnosis not present

## 2016-04-24 DIAGNOSIS — D123 Benign neoplasm of transverse colon: Secondary | ICD-10-CM | POA: Diagnosis not present

## 2016-04-24 NOTE — Therapy (Signed)
[place], [place], [address] Phone: [phone]   Fax:  [phone]  Physical Therapy Treatment  Patient Details  Name: Samantha Lester MRN: [id number] Date of Birth: 1966/01/01 Referring Provider: Valerie Salts [facility]  Encounter Date: 04/24/2016      PT End of Session - 04/24/16 1248    Visit Number 7   Number of Visits 16   Date for PT Re-Evaluation 05/29/16   Authorization Type [employer]    PT Start Time [id number]   PT Stop Time 1059   PT Time Calculation (min) 44 min   Activity Tolerance Patient tolerated treatment well   Behavior During Therapy [facility] for tasks assessed/performed      Past Medical History:  Diagnosis Date  . Coronary artery dissection   . GERD (gastroesophageal reflux disease)   . Hyperlipidemia   . Hypertension     Past Surgical History:  Procedure Laterality Date  . CARDIAC CATHETERIZATION N/A 01/09/2015   Procedure: Left Heart Cath and Coronary Angiography;  Surgeon: Peter M Martinique, MD;  Location: [facility];  Service: Cardiovascular;  Laterality: N/A;  . CARDIAC CATHETERIZATION N/A 01/12/2015   Procedure: Left Heart Cath and Coronary Angiography;  Surgeon: Sherren Mocha, MD;  Location: [facility];  Service: Cardiovascular;  Laterality: N/A;    There were no vitals filed for this visit.      Subjective Assessment - 04/24/16 1244    Subjective Patient reports she has been sore since last visit on the bottom of her foot. It has improved but her pain is still there. It is the smae pain she has had. She has her MRI results. Per MRI she has a torn spring ligament and moderate tedonosis of her posteriro tib ligament. She has no-showed to her Dr's appointment 2x. She was advised to go see her MD to find out what the best course of [facility] is for her. She is not longer having as much pain in her posteriro tib area.    Limitations Sitting;Walking   How long can you sit comfortably?  not limited    How long can you stand comfortably? > 20 min    How long can you walk comfortably? limited community distances    Diagnostic tests Nothing at this time    Patient Stated Goals To stand at work without increased pain    Currently in Pain? Yes   Pain Score 2    Pain Location Ankle   Pain Orientation Right;Left   Pain Onset More than a month ago   Aggravating Factors  standing and walking    Pain Relieving Factors rest and ice    Effect of Pain on Daily Activities difficulty standing                                  PT Education - 04/24/16 1248    Education provided Yes   Education Details improtance of strengthening    Person(s) Educated Patient   Methods Explanation;Demonstration   Comprehension Verbalized understanding;Returned demonstration          PT Short Term Goals - 04/23/16 0828      PT SHORT TERM GOAL #1   Title Patient will increase left single leg stance time to 15 seconds    Baseline 5 seconds with bilateral UE support    Time 4   Period Weeks   Status On-going  PT SHORT TERM GOAL #2   Title Patient will increase gross left ankle strength to 4+/5    Baseline IV/ EV 4/5    Time 4   Period Weeks   Status On-going     PT SHORT TERM GOAL #3   Title Patient will report 2/10 pain at worst   Baseline leg heavy the next day after walking 1/2 mile the day before   Time 4   Period Weeks   Status On-going     PT SHORT TERM GOAL #4   Title Patient will be independent wih initial HEP    Time 4   Period Weeks   Status Achieved           PT Long Term Goals - 04/04/16 1157      PT LONG TERM GOAL #1   Title Patient will stand for 2 hours at work without self report of pain    Time 8   Period Weeks   Status New     PT LONG TERM GOAL #2   Title Patient will ambulate i mile without report of pain in order to go shopping    Time 8   Period Weeks     PT LONG TERM GOAL #3   Title Patient will demostrate 5/5  gross left ankle strength in order to perfrom ADL's and IADL's    Time 8   Period Weeks   Status On-going               Plan - 04/24/16 1251    Clinical Impression Statement Patient continues to have tingling in her back and pain in her feet. She was advised to go back to her MD. Therapy gave her easier exercises. She will be going back to work tomorrow.    Rehab Potential Good   PT Frequency 2x / week   PT Duration 12 weeks   PT Treatment/Interventions ADLs/Self Care Home Management;Iontophoresis 4mg /ml Dexamethasone;Cryotherapy;Electrical Stimulation;Gait training;Stair training;Functional mobility training;Ultrasound;Therapeutic activities;Therapeutic exercise;Neuromuscular re-education;Patient/family education;Orthotic Fit/Training;Passive range of motion;Manual techniques;Dry needling   PT Next Visit Plan continue with strengthening, and IASTYM. Assess tolerance to new exercises.    PT Home Exercise Plan Straight DF stretch   Consulted and Agree with Plan of Care Patient      Patient will benefit from skilled therapeutic intervention in order to improve the following deficits and impairments:  Abnormal gait, Decreased mobility, Decreased strength, Pain, Decreased activity tolerance, Difficulty walking, Decreased range of motion  Visit Diagnosis: Pain in left ankle and joints of left foot  Muscle weakness (generalized)  Difficulty in walking, not elsewhere classified  Pain in right ankle and joints of right foot     Problem List Patient Active Problem List   Diagnosis Date Noted  . Near syncope 11/12/2015  . Ischemic cardiomyopathy 02/05/2015  . Hyperlipidemia 02/05/2015  . Essential hypertension 01/09/2015  . Coronary artery dissection   . NSTEMI (non-ST elevated myocardial infarction) ([place]) 01/08/2015    Carney Living PT DPT  04/24/2016, 12:56 PM  [facility] [address] [place], [place],  [address] Phone: [phone]   Fax:  [phone]  Name: Samantha Lester MRN: [id number] Date of Birth: 1965-07-30

## 2016-04-28 ENCOUNTER — Ambulatory Visit: Payer: 59 | Admitting: Physical Therapy

## 2016-04-29 DIAGNOSIS — M5432 Sciatica, left side: Secondary | ICD-10-CM | POA: Diagnosis not present

## 2016-04-29 DIAGNOSIS — M545 Low back pain: Secondary | ICD-10-CM | POA: Diagnosis not present

## 2016-04-29 DIAGNOSIS — M5416 Radiculopathy, lumbar region: Secondary | ICD-10-CM | POA: Diagnosis not present

## 2016-04-30 ENCOUNTER — Ambulatory Visit: Payer: 59 | Admitting: Physical Therapy

## 2016-04-30 DIAGNOSIS — M25571 Pain in right ankle and joints of right foot: Secondary | ICD-10-CM

## 2016-04-30 DIAGNOSIS — D123 Benign neoplasm of transverse colon: Secondary | ICD-10-CM | POA: Diagnosis not present

## 2016-04-30 DIAGNOSIS — M5442 Lumbago with sciatica, left side: Secondary | ICD-10-CM

## 2016-04-30 DIAGNOSIS — M62830 Muscle spasm of back: Secondary | ICD-10-CM

## 2016-04-30 DIAGNOSIS — M25572 Pain in left ankle and joints of left foot: Secondary | ICD-10-CM

## 2016-04-30 DIAGNOSIS — M6281 Muscle weakness (generalized): Secondary | ICD-10-CM | POA: Diagnosis not present

## 2016-04-30 DIAGNOSIS — R262 Difficulty in walking, not elsewhere classified: Secondary | ICD-10-CM | POA: Diagnosis not present

## 2016-04-30 NOTE — Therapy (Signed)
[place], [place], [address] Phone: [phone]   Fax:  [phone]  Physical Therapy Evaluation  Patient Details  Name: Samantha Lester MRN: [id number] Date of Birth: 01-03-1966 Referring Provider: Lacie Draft PA   Encounter Date: 04/30/2016      PT End of Session - 04/30/16 1650    Visit Number 8   Number of Visits 24   Date for PT Re-Evaluation 06/25/16   Authorization Type [employer]    PT Start Time 0846   PT Stop Time 0929   PT Time Calculation (min) 43 min   Activity Tolerance Patient tolerated treatment well   Behavior During Therapy [facility] for tasks assessed/performed      Past Medical History:  Diagnosis Date  . Coronary artery dissection   . GERD (gastroesophageal reflux disease)   . Hyperlipidemia   . Hypertension     Past Surgical History:  Procedure Laterality Date  . CARDIAC CATHETERIZATION N/A 01/09/2015   Procedure: Left Heart Cath and Coronary Angiography;  Surgeon: Peter M Martinique, MD;  Location: [place] [facility];  Service: Cardiovascular;  Laterality: N/A;  . CARDIAC CATHETERIZATION N/A 01/12/2015   Procedure: Left Heart Cath and Coronary Angiography;  Surgeon: Sherren Mocha, MD;  Location: [facility];  Service: Cardiovascular;  Laterality: N/A;    There were no vitals filed for this visit.       Subjective Assessment - 04/30/16 1644    Subjective The patient presents today with a script for lumbar rehabiliation. She has pain and tingling down her left leg. The pain is worse when she is working. She feels like she can no longer put full weight on the left side. She feels like the tenderness to palpation around her posteriro tib tnedon has decreased. She is still having foot pain but it is in the bottom of both feet.    How long can you sit comfortably? not limited    How long can you stand comfortably? < 10 min    How long can you walk comfortably? Limited community  distances    Diagnostic tests Scheduled for an MRI at this time.    Patient Stated Goals To stand at work without increased pain    Currently in Pain? Yes   Pain Score 7    Pain Location Back   Pain Orientation Right;Left   Pain Descriptors / Indicators Aching   Pain Type Acute pain   Pain Radiating Towards into left foot and at times pain into the bottom of the right foot.    Pain Onset More than a month ago   Pain Frequency Constant   Aggravating Factors  sttanding and walking    Pain Relieving Factors rest and ice    Effect of Pain on Daily Activities difficulty standing             [facility] PT Assessment - 04/30/16 1549      Assessment   Medical Diagnosis Low back pain    Referring Provider Lacie Draft PA    Onset Date/Surgical Date --  6 weeks prior    Hand Dominance Right   Next MD Visit --  > 1 month    Prior Therapy For bilateral feet.      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 Patient has tried custom orthotics in the past.    Focus on Therapeutic Outcomes (FOTO)  Ankle      Sensation   Additional Comments Numbness into the left foot. Numbness increases with activity      Coordination   Gross Motor Movements are Fluid and Coordinated No     Posture/Postural Control   Posture Comments Bilateral toe out      AROM   AROM Assessment Site Lumbar   Lumbar Flexion Limited 25% with increase in pain     Lumbar Extension Limited 50% with increase in pain    Lumbar - Right Side Bend Limited 25%    Lumbar - Left Side Bend Limited 25%    Lumbar - Right Rotation Normal    Lumbar - Left Rotation Normal      Flexibility   Soft Tissue Assessment /Muscle Length yes   Piriformis limited piriformis movement on the left normal on right      Palpation   Palpation comment Spasming into left upper glut and into left quadratus. Tenderness to palpation noted      Special Tests    Special Tests --  (+) SLR test at 90 degrees      Ambulation/Gait   Gait Comments Decreased hip flexion bilateral, continued supination to pronation on the left.                    [facility] Adult PT Treatment/Exercise - 04/30/16 1549      Lumbar Exercises: Stretches   Piriformis Stretch Limitations 2x20 sec left      Lumbar Exercises: Supine   AB Set Limitations x10    Bent Knee Raise Limitations 2x10    Other Supine Lumbar Exercises Prone on elbows 3 minutes      Manual Therapy   Manual therapy comments IASTYM to lumbar spine to decrease inflammation                 PT Education - 04/30/16 1650    Education provided Yes   Education Details symptom mangement with back pain. radicular response to prone lying    Person(s) Educated Patient   Methods Explanation;Demonstration;Verbal cues;Handout;Tactile cues   Comprehension Verbalized understanding;Returned demonstration;Need further instruction          PT Short Term Goals - 04/30/16 1702      PT SHORT TERM GOAL #1   Title Patient will increase left single leg stance time to 15 seconds    Baseline 5 seconds with bilateral UE support    Time 4   Period Weeks   Status On-going     PT SHORT TERM GOAL #2   Title Patient will increase gross left ankle strength to 4+/5    Baseline IV/ EV 4/5    Time 4   Period Weeks   Status On-going     PT SHORT TERM GOAL #3   Title Patient will report 2/10 pain at worst   Baseline pain improved in  ankle but can still reach 7-8/10 in her foot  Time 4   Period Weeks   Status On-going     PT SHORT TERM GOAL #4   Title Patient will be independent wih initial HEP    Baseline gievn new HEP for lumbar spine    Time 4   Period Weeks   Status On-going     PT SHORT TERM GOAL #5   Title Patient will increase gross left lower extrmeity strength to 4+/5    Time 4   Period Weeks   Status New     Additional Short Term Goals   Additional Short Term Goals Yes     PT SHORT TERM GOAL #6   Title Patient will demsotrate a good core contraction    Time 4   Period Weeks   Status New     PT SHORT TERM GOAL #7   Title Patient will report no pain radiating into her left foot    Time 4   Period Weeks   Status New           PT Long Term Goals - 04/30/16 1706      PT LONG TERM GOAL #1   Title Patient will stand for 2 hours at work without self report of pain    Baseline able to do with less pain   Time 8   Period Weeks   Status New     PT LONG TERM GOAL #2   Title Patient will ambulate i mile without report of pain in order to go shopping    Time 8   Period Weeks   Status On-going     PT LONG TERM GOAL #3   Title Patient will demostrate 5/5 gross left ankle strength in order to perfrom ADL's and IADL's    Baseline all long term goasl remain the same    Time 8   Period Weeks   Status On-going     PT LONG TERM GOAL #4   Title Pt will demonstrate appropraite heel toe gait without verbal cues by 6/8   Baseline gait improving with supportive    Time 6   Period Weeks   Status Achieved               Plan - 04/30/16 1652    Clinical Impression Statement Patient is a 50 year old patient with low back pain radiating into her left leg and foot. The pain is increasing when she is standing and when she is at work. She is having difficulty perfroming ADL's. She had some improvement in radicular symptoms with prone lying. Signs and symptoms are consistent with a lumbar disc  buldge. She was given stretches and some light exercises for core stabilization. The pateitn was advised strongly to follow her symptoms. At this time her symptoms are very reactive to movement.  Therapy will treat the patients foot as needed.    Rehab Potential Good   PT Frequency 2x / week   PT Duration 12 weeks   PT Treatment/Interventions ADLs/Self Care Home Management;Iontophoresis 4mg /ml Dexamethasone;Cryotherapy;Electrical Stimulation;Gait training;Stair training;Functional mobility training;Ultrasound;Therapeutic activities;Therapeutic exercise;Neuromuscular re-education;Patient/family education;Orthotic Fit/Training;Passive range of motion;Manual techniques;Dry needling   PT Next Visit Plan continue with strengthening, and IASTYM. Assess tolerance to new exercises.    PT Home Exercise Plan Straight DF stretch   Consulted and Agree with Plan of Care Patient      Patient will benefit from skilled therapeutic intervention in order to improve the following deficits and impairments:  Abnormal gait, Decreased mobility, Decreased strength, Pain, Decreased  activity tolerance, Difficulty walking, Decreased range of motion  Visit Diagnosis: Acute left-sided low back pain with left-sided sciatica - Plan: PT plan of care cert/re-cert  Pain in left ankle and joints of left foot - Plan: PT plan of care cert/re-cert  Muscle weakness (generalized) - Plan: PT plan of care cert/re-cert  Difficulty in walking, not elsewhere classified - Plan: PT plan of care cert/re-cert  Pain in right ankle and joints of right foot - Plan: PT plan of care cert/re-cert  Muscle spasm of back - Plan: PT plan of care cert/re-cert     Problem List Patient Active Problem List   Diagnosis Date Noted  . Near syncope 11/12/2015  . Ischemic cardiomyopathy 02/05/2015  . Hyperlipidemia 02/05/2015  . Essential hypertension 01/09/2015  . Coronary artery dissection   . NSTEMI (non-ST elevated myocardial infarction)  ([place]) 01/08/2015    Carney Living PT DPT  04/30/2016, 5:12 PM  [facility] [address] [place], [place], [address] Phone: [phone]   Fax:  [phone]  Name: Samantha Lester MRN: [id number] Date of Birth: 02-24-1966

## 2016-05-05 ENCOUNTER — Ambulatory Visit: Payer: 59 | Admitting: Physical Therapy

## 2016-05-05 DIAGNOSIS — M6281 Muscle weakness (generalized): Secondary | ICD-10-CM

## 2016-05-05 DIAGNOSIS — D123 Benign neoplasm of transverse colon: Secondary | ICD-10-CM | POA: Diagnosis not present

## 2016-05-05 DIAGNOSIS — M25572 Pain in left ankle and joints of left foot: Secondary | ICD-10-CM | POA: Diagnosis not present

## 2016-05-05 DIAGNOSIS — M25571 Pain in right ankle and joints of right foot: Secondary | ICD-10-CM | POA: Diagnosis not present

## 2016-05-05 DIAGNOSIS — M5442 Lumbago with sciatica, left side: Secondary | ICD-10-CM | POA: Diagnosis not present

## 2016-05-05 DIAGNOSIS — R262 Difficulty in walking, not elsewhere classified: Secondary | ICD-10-CM

## 2016-05-05 DIAGNOSIS — M62830 Muscle spasm of back: Secondary | ICD-10-CM | POA: Diagnosis not present

## 2016-05-05 NOTE — Therapy (Addendum)
[facility] [place], [place], [address] Phone: [phone]   Fax:  [phone]  Physical Therapy Treatment  Patient Details  Name: Samantha Lester MRN: [id number] Date of Birth: 1965-08-29 Referring Provider: Lacie Draft PA   Encounter Date: 05/05/2016      PT End of Session - 05/05/16 0913    Visit Number 9   Number of Visits 24   Date for PT Re-Evaluation 06/25/16   Authorization Type [employer]    PT Start Time 0850   PT Stop Time 0930   PT Time Calculation (min) 40 min   Activity Tolerance Patient tolerated treatment well   Behavior During Therapy [facility] for tasks assessed/performed      Past Medical History:  Diagnosis Date  . Coronary artery dissection   . GERD (gastroesophageal reflux disease)   . Hyperlipidemia   . Hypertension     Past Surgical History:  Procedure Laterality Date  . CARDIAC CATHETERIZATION N/A 01/09/2015   Procedure: Left Heart Cath and Coronary Angiography;  Surgeon: Peter M Martinique, MD;  Location: [facility];  Service: Cardiovascular;  Laterality: N/A;  . CARDIAC CATHETERIZATION N/A 01/12/2015   Procedure: Left Heart Cath and Coronary Angiography;  Surgeon: Sherren Mocha, MD;  Location: [facility];  Service: Cardiovascular;  Laterality: N/A;    There were no vitals filed for this visit.      Subjective Assessment - 05/05/16 0903    Subjective Patient reports she was feeling good until she went to wok. When she stood at work she had significant tingling into her leg.    Limitations Sitting;Walking   How long can you sit comfortably? not limited    How long can you stand comfortably? < 10 min    How long can you walk comfortably? Limited community distances    Diagnostic tests Scheduled for an MRI at this time.    Patient Stated Goals To stand at work without increased pain    Currently in Pain? Yes   Pain Score 6    Pain Location Back   Pain Orientation Left;Right    Pain Descriptors / Indicators Aching   Pain Radiating Towards pain into the left foot    Pain Onset More than a month ago   Pain Frequency Constant   Aggravating Factors  standing and walking    Pain Relieving Factors rest and ice                          [facility] Adult PT Treatment/Exercise - 05/05/16 0001      Lumbar Exercises: Stretches   Piriformis Stretch Limitations 2x20 sec left      Lumbar Exercises: Supine   AB Set Limitations x10    Clam Limitations 2x10 red    Bent Knee Raise Limitations 2x10    Bridge Limitations 2x10   Other Supine Lumbar Exercises No change with prone positioning; Supine shoulder flexion yello 2x10      Knee/Hip Exercises: Machines for Strengthening   Other Machine heel raise 40lb 2x10 increase in bilateral foot pain in her arches      Manual Therapy   Manual therapy comments IASTYM to lumbar spine to decrease inflammation                 PT Education - 05/05/16 0907    Education provided Yes   Education Details continue to work on stretches. Reviewed HEP    Person(s)  Educated Patient   Methods Explanation;Demonstration;Verbal cues;Tactile cues   Comprehension Verbalized understanding;Returned demonstration;Need further instruction          PT Short Term Goals - 04/30/16 1702      PT SHORT TERM GOAL #1   Title Patient will increase left single leg stance time to 15 seconds    Baseline 5 seconds with bilateral UE support    Time 4   Period Weeks   Status On-going     PT SHORT TERM GOAL #2   Title Patient will increase gross left ankle strength to 4+/5    Baseline IV/ EV 4/5    Time 4   Period Weeks   Status On-going     PT SHORT TERM GOAL #3   Title Patient will report 2/10 pain at worst   Baseline pain improved in ankle but can still reach 7-8/10 in her foot    Time 4   Period Weeks   Status On-going     PT SHORT TERM GOAL #4   Title Patient will be independent wih initial HEP    Baseline gievn new  HEP for lumbar spine    Time 4   Period Weeks   Status On-going     PT SHORT TERM GOAL #5   Title Patient will increase gross left lower extrmeity strength to 4+/5    Time 4   Period Weeks   Status New     Additional Short Term Goals   Additional Short Term Goals Yes     PT SHORT TERM GOAL #6   Title Patient will demsotrate a good core contraction    Time 4   Period Weeks   Status New     PT SHORT TERM GOAL #7   Title Patient will report no pain radiating into her left foot    Time 4   Period Weeks   Status New           PT Long Term Goals - 04/30/16 1706      PT LONG TERM GOAL #1   Title Patient will stand for 2 hours at work without self report of pain    Baseline able to do with less pain   Time 8   Period Weeks   Status New     PT LONG TERM GOAL #2   Title Patient will ambulate i mile without report of pain in order to go shopping    Time 8   Period Weeks   Status On-going     PT LONG TERM GOAL #3   Title Patient will demostrate 5/5 gross left ankle strength in order to perfrom ADL's and IADL's    Baseline all long term goasl remain the same    Time 8   Period Weeks   Status On-going     PT LONG TERM GOAL #4   Title Pt will demonstrate appropraite heel toe gait without verbal cues by 6/8   Baseline gait improving with supportive    Time 6   Period Weeks   Status Achieved               Plan - 05/05/16 0913    Clinical Impression Statement Patient had an increase in symptoms with lateral trunk trotations. Therapy educated her on technique and the paain improved. She has some cramping with knee flexion as well. She tolerated manual therapy well The patient is supposed to have an MRI. she has not heard anything in over a week.  She was encouraged to follow up. She is not improving much. She continues to have radiuclar pain.    Rehab Potential Good   PT Frequency 2x / week   PT Duration 12 weeks   PT Treatment/Interventions ADLs/Self Care Home  Management;Iontophoresis 55m/ml Dexamethasone;Cryotherapy;Electrical Stimulation;Gait training;Stair training;Functional mobility training;Ultrasound;Therapeutic activities;Therapeutic exercise;Neuromuscular re-education;Patient/family education;Orthotic Fit/Training;Passive range of motion;Manual techniques;Dry needling   PT Next Visit Plan continue with strengthening, and IASTYM. Assess tolerance to new exercises.    PT Home Exercise Plan Straight DF stretch      Patient will benefit from skilled therapeutic intervention in order to improve the following deficits and impairments:  Abnormal gait, Decreased mobility, Decreased strength, Pain, Decreased activity tolerance, Difficulty walking, Decreased range of motion  Visit Diagnosis: Pain in left ankle and joints of left foot  Muscle weakness (generalized)  Difficulty in walking, not elsewhere classified  Pain in right ankle and joints of right foot  Acute left-sided low back pain with left-sided sciatica  Muscle spasm of back  PHYSICAL THERAPY DISCHARGE SUMMARY  Visits from Start of Care: 9  Current functional level related to goals / functional outcomes: No significant pain    Remaining deficits: No significant deficits    Education / Equipment: HEP   Plan: Patient agrees to discharge.  Patient goals were not met. Patient is being discharged due to not returning since the last visit.  ?????       Problem List Patient Active Problem List   Diagnosis Date Noted  . Near syncope 11/12/2015  . Ischemic cardiomyopathy 02/05/2015  . Hyperlipidemia 02/05/2015  . Essential hypertension 01/09/2015  . Coronary artery dissection   . NSTEMI (non-ST elevated myocardial infarction) ([facility] 01/08/2015    DCarney LivingPT DPT  05/05/2016, 3:23 PM  [facility]GW[address] [place] [address]Phone: [phone]  Fax:  [phone] Name: Samantha SandstromMRN:  [id number] of Birth: [birth year]

## 2016-05-07 ENCOUNTER — Ambulatory Visit: Payer: 59 | Admitting: Physical Therapy

## 2016-05-07 ENCOUNTER — Ambulatory Visit (INDEPENDENT_AMBULATORY_CARE_PROVIDER_SITE_OTHER): Payer: 59 | Admitting: Obstetrics and Gynecology

## 2016-05-07 ENCOUNTER — Other Ambulatory Visit: Payer: Self-pay | Admitting: Obstetrics and Gynecology

## 2016-05-07 ENCOUNTER — Encounter: Payer: Self-pay | Admitting: Obstetrics and Gynecology

## 2016-05-07 VITALS — BP 130/90 | HR 76 | Resp 16 | Ht 60.5 in | Wt 187.0 lb

## 2016-05-07 DIAGNOSIS — Z01419 Encounter for gynecological examination (general) (routine) without abnormal findings: Secondary | ICD-10-CM

## 2016-05-07 DIAGNOSIS — F4320 Adjustment disorder, unspecified: Secondary | ICD-10-CM

## 2016-05-07 DIAGNOSIS — Z1211 Encounter for screening for malignant neoplasm of colon: Secondary | ICD-10-CM | POA: Diagnosis not present

## 2016-05-07 DIAGNOSIS — Z124 Encounter for screening for malignant neoplasm of cervix: Secondary | ICD-10-CM | POA: Diagnosis not present

## 2016-05-07 DIAGNOSIS — E2839 Other primary ovarian failure: Secondary | ICD-10-CM | POA: Diagnosis not present

## 2016-05-07 DIAGNOSIS — Z0000 Encounter for general adult medical examination without abnormal findings: Secondary | ICD-10-CM | POA: Diagnosis not present

## 2016-05-07 DIAGNOSIS — Z1151 Encounter for screening for human papillomavirus (HPV): Secondary | ICD-10-CM | POA: Diagnosis not present

## 2016-05-07 DIAGNOSIS — E288 Other ovarian dysfunction: Secondary | ICD-10-CM

## 2016-05-07 DIAGNOSIS — M8580 Other specified disorders of bone density and structure, unspecified site: Secondary | ICD-10-CM | POA: Diagnosis not present

## 2016-05-07 DIAGNOSIS — Z1231 Encounter for screening mammogram for malignant neoplasm of breast: Secondary | ICD-10-CM

## 2016-05-07 NOTE — Progress Notes (Signed)
50 y.o. [id number] MarriedCaucasianF here for annual exam.   She used to take Celexa for anxiety, stopped it because of sexual dysfunction.  She has ADD, she was on adder all, but had an MI and had to go off it.  Her primary put her on Contrave, for anxiety and weight loss. No weight loss on the Contrave.  Menopausal for years. She stopped having normal cycles at 30, last cycle around age 28.     No LMP recorded (lmp unknown). Patient is postmenopausal.          Sexually active: Yes.    The current method of family planning is post menopausal status.    Exercising: Yes.    walking, aerobics Smoker:  no  Health Maintenance: Pap:  2014 WNL  History of abnormal Pap:  no MMG:  2015- needed F/U imaging done but patient did not F/U Colonoscopy:  Never BMD:   2008 Osteopenia per patient  TDaP:  2016  Gardasil: N/A   reports that she has never smoked. She has never used smokeless tobacco. She reports that she drinks alcohol. She reports that she does not use drugs.She is a Passenger transport manager at [employer]. She has 2 kids (42 and 20), she is raising is 23 and 43 year old grandchildren. Daughter has another baby that she is raising. Daughter has borderline personality disorder.   Past Medical History:  Diagnosis Date  . Anxiety   . Coronary artery dissection   . GERD (gastroesophageal reflux disease)   . Hyperlipidemia   . Hypertension   . Thyroid disease     Past Surgical History:  Procedure Laterality Date  . CARDIAC CATHETERIZATION N/A 01/09/2015   Procedure: Left Heart Cath and Coronary Angiography;  Surgeon: Peter M Martinique, MD;  Location: [place];  Service: Cardiovascular;  Laterality: N/A;  . CARDIAC CATHETERIZATION N/A 01/12/2015   Procedure: Left Heart Cath and Coronary Angiography;  Surgeon: Sherren Mocha, MD;  Location: [facility];  Service: Cardiovascular;  Laterality: N/A;  . TUBAL LIGATION     had reversal 1998  . tubal ligation reversal  1998     Current Outpatient Prescriptions  Medication Sig Dispense Refill  . aspirin EC 81 MG EC tablet Take 1 tablet (81 mg total) by mouth daily.    . isosorbide mononitrate (IMDUR) 60 MG 24 hr tablet Take 1 tablet (60 mg total) by mouth daily. 90 tablet 3  . levothyroxine (SYNTHROID, LEVOTHROID) 50 MCG tablet Take 1 tablet (50 mcg total) by mouth daily. 30 tablet 0  . lisinopril (PRINIVIL,ZESTRIL) 20 MG tablet Take 1 tablet (20 mg total) by mouth daily. 90 tablet 3  . Naltrexone-Bupropion HCl ER (CONTRAVE) 8-90 MG TB12 Take 90 mg by mouth 1 day or 1 dose.    . nitroGLYCERIN (NITROSTAT) 0.4 MG SL tablet Place 1 tablet (0.4 mg total) under the tongue every 5 (five) minutes x 3 doses as needed for chest pain. 25 tablet 12   No current facility-administered medications for this visit.     Family History  Problem Relation Age of Onset  . Hypertension Mother   . Healthy Father   . Kidney disease Sister   . Hypertension Sister   . Healthy Sister   . Drug abuse Brother   . Alcohol abuse Brother   . Hypertension Brother     Review of Systems  Constitutional: Negative.   HENT: Negative.   Eyes: Negative.   Respiratory: Negative.   Cardiovascular:  Negative.   Gastrointestinal: Negative.   Endocrine: Negative.   Genitourinary: Negative.   Musculoskeletal: Negative.   Skin: Negative.   Allergic/Immunologic: Negative.   Neurological: Negative.   Psychiatric/Behavioral: Negative.        Anxiety     Exam:   BP 130/90 (BP Location: Right Arm, Patient Position: Sitting, Cuff Size: Normal)   Pulse 76   Resp 16   Ht 5' 0.5" (1.537 m)   Wt 187 lb (84.8 kg)   LMP  (LMP Unknown)   BMI 35.92 kg/m   Weight change: @WEIGHTCHANGE @ Height:   Height: 5' 0.5" (153.7 cm)  Ht Readings from Last 3 Encounters:  05/07/16 5' 0.5" (1.537 m)  11/12/15 5\' 1"  (1.549 m)  07/13/15 5\' 1"  (1.549 m)    General appearance: alert, cooperative and appears stated age Head: Normocephalic, without obvious  abnormality, atraumatic Neck: no adenopathy, supple, symmetrical, trachea midline and thyroid normal to inspection and palpation Lungs: clear to auscultation bilaterally Breasts: normal appearance, no masses or tenderness Heart: regular rate and rhythm Abdomen: soft, non-tender; bowel sounds normal; no masses,  no organomegaly Extremities: extremities normal, atraumatic, no cyanosis or edema Skin: Skin color, texture, turgor normal. No rashes or lesions Lymph nodes: Cervical, supraclavicular, and axillary nodes normal. No abnormal inguinal nodes palpated Neurologic: Grossly normal   Pelvic: External genitalia:  no lesions              Urethra:  normal appearing urethra with no masses, tenderness or lesions              Bartholins and Skenes: normal                 Vagina: normal appearing vagina with normal color and discharge, no lesions              Cervix: no lesions               Bimanual Exam:  Uterus:  normal size, contour, position, consistency, mobility, non-tender              Adnexa: no mass, fullness, tenderness               Rectovaginal: Confirms               Anus:  normal sphincter tone, no lesions  Chaperone was present for exam.  A:  Well Woman with normal exam  Overweight, no weight loss with Contrave  Anxiety, she feels it is helped with Wellbutrin  History of premature ovarian failure  P:   Discussed exercise and lifestyle changes   Discussed weight watchers  F/U with her primary for management of her anxiety  Pap with hpv  Mammogram  Colonoscopy, we will set it up for her  Discussed breast self exam  Discussed calcium and vit D intake

## 2016-05-07 NOTE — Patient Instructions (Signed)

## 2016-05-08 LAB — VITAMIN D 25 HYDROXY (VIT D DEFICIENCY, FRACTURES): VIT D 25 HYDROXY: 23 ng/mL — AB (ref 30–100)

## 2016-05-09 ENCOUNTER — Encounter: Payer: Self-pay | Admitting: Gastroenterology

## 2016-05-09 LAB — IPS PAP TEST WITH HPV

## 2016-05-12 ENCOUNTER — Ambulatory Visit: Payer: 59 | Admitting: Physical Therapy

## 2016-05-14 ENCOUNTER — Ambulatory Visit: Payer: 59 | Admitting: Physical Therapy

## 2016-05-21 ENCOUNTER — Ambulatory Visit
Admission: RE | Admit: 2016-05-21 | Discharge: 2016-05-21 | Disposition: A | Discharge status: Home / Self Care | Payer: 59 | Source: Ambulatory Visit | Attending: Obstetrics and Gynecology | Admitting: Obstetrics and Gynecology

## 2016-05-21 DIAGNOSIS — M8589 Other specified disorders of bone density and structure, multiple sites: Secondary | ICD-10-CM | POA: Diagnosis not present

## 2016-05-21 DIAGNOSIS — Z780 Asymptomatic menopausal state: Secondary | ICD-10-CM | POA: Diagnosis not present

## 2016-05-21 DIAGNOSIS — Z1231 Encounter for screening mammogram for malignant neoplasm of breast: Secondary | ICD-10-CM | POA: Diagnosis not present

## 2016-05-21 DIAGNOSIS — E288 Other ovarian dysfunction: Principal | ICD-10-CM

## 2016-05-21 DIAGNOSIS — E2839 Other primary ovarian failure: Secondary | ICD-10-CM

## 2016-05-28 MED FILL — LEVOTHYROXINE 50 MCG TABLET: 50 | 30 days supply | Qty: 30 | Fill #0

## 2016-05-28 MED FILL — CONTRAVE ER 8-90 MG TABLET: 8-90 | 30 days supply | Qty: 120 | Fill #1

## 2016-06-03 ENCOUNTER — Other Ambulatory Visit: Payer: Self-pay | Admitting: Obstetrics and Gynecology

## 2016-06-03 DIAGNOSIS — R928 Other abnormal and inconclusive findings on diagnostic imaging of breast: Secondary | ICD-10-CM

## 2016-06-03 DIAGNOSIS — N6489 Other specified disorders of breast: Secondary | ICD-10-CM

## 2016-06-04 ENCOUNTER — Telehealth: Payer: Self-pay | Admitting: Obstetrics and Gynecology

## 2016-06-04 NOTE — Telephone Encounter (Signed)
Order for right breast diagnostic with ultrasound signed by Dr.Jertson and faxed with cover sheet and confirmation to the [facility].  Routing to provider for final review. Patient agreeable to disposition. Will close encounter.

## 2016-06-04 NOTE — Telephone Encounter (Signed)
Patient has appt tomorrow for mammogram and ultrasound of rt breast and they need an order faxed to the [facility] Fax# [phone]

## 2016-06-04 NOTE — Telephone Encounter (Signed)
Order to [facility] for review and signature to fax back to the [facility].

## 2016-06-05 ENCOUNTER — Ambulatory Visit
Admission: RE | Admit: 2016-06-05 | Discharge: 2016-06-05 | Disposition: A | Discharge status: Home / Self Care | Payer: 59 | Source: Ambulatory Visit | Attending: Obstetrics and Gynecology | Admitting: Obstetrics and Gynecology

## 2016-06-05 DIAGNOSIS — N6489 Other specified disorders of breast: Secondary | ICD-10-CM

## 2016-06-05 DIAGNOSIS — N6313 Unspecified lump in the right breast, lower outer quadrant: Secondary | ICD-10-CM | POA: Diagnosis not present

## 2016-06-05 DIAGNOSIS — R928 Other abnormal and inconclusive findings on diagnostic imaging of breast: Secondary | ICD-10-CM

## 2016-06-18 MED FILL — ATORVASTATIN 80 MG TABLET: 80 | 90 days supply | Qty: 90 | Fill #0

## 2016-07-01 MED FILL — LEVOTHYROXINE 50 MCG TABLET: 50 | 30 days supply | Qty: 30 | Fill #0

## 2016-07-09 ENCOUNTER — Ambulatory Visit: Payer: 59 | Admitting: *Deleted

## 2016-07-09 VITALS — Ht 60.0 in | Wt 188.8 lb

## 2016-07-09 DIAGNOSIS — Z1211 Encounter for screening for malignant neoplasm of colon: Secondary | ICD-10-CM

## 2016-07-09 MED ORDER — NA SULFATE-K SULFATE-MG SULF 17.5-3.13-1.6 GM/177ML PO SOLN: 1.0000 | Freq: Once | ORAL | 0 refills | Status: AC

## 2016-07-09 MED FILL — SUPREP BOWEL PREP KIT: 17.5-3.13-1 | 1 days supply | Qty: 354 | Fill #0

## 2016-07-09 NOTE — Progress Notes (Signed)
Denies allergies to eggs or soy products. Denies complications with sedation or anesthesia. Denies O2 use. Denies use of diet or weight loss medications.  Emmi instructions given for colonoscopy.  

## 2016-07-23 ENCOUNTER — Encounter: Payer: Self-pay | Admitting: Gastroenterology

## 2016-07-23 ENCOUNTER — Ambulatory Visit (AMBULATORY_SURGERY_CENTER): Payer: 59 | Admitting: Gastroenterology

## 2016-07-23 VITALS — BP 135/73 | HR 62 | Temp 98.1°F | Resp 15 | Ht 60.0 in | Wt 188.0 lb

## 2016-07-23 DIAGNOSIS — D123 Benign neoplasm of transverse colon: Secondary | ICD-10-CM | POA: Diagnosis not present

## 2016-07-23 DIAGNOSIS — D122 Benign neoplasm of ascending colon: Secondary | ICD-10-CM | POA: Diagnosis not present

## 2016-07-23 DIAGNOSIS — I10 Essential (primary) hypertension: Secondary | ICD-10-CM | POA: Diagnosis not present

## 2016-07-23 DIAGNOSIS — I252 Old myocardial infarction: Secondary | ICD-10-CM | POA: Diagnosis not present

## 2016-07-23 DIAGNOSIS — Z1211 Encounter for screening for malignant neoplasm of colon: Secondary | ICD-10-CM | POA: Diagnosis present

## 2016-07-23 DIAGNOSIS — D125 Benign neoplasm of sigmoid colon: Secondary | ICD-10-CM | POA: Diagnosis not present

## 2016-07-23 DIAGNOSIS — I2510 Atherosclerotic heart disease of native coronary artery without angina pectoris: Secondary | ICD-10-CM | POA: Diagnosis not present

## 2016-07-23 DIAGNOSIS — Z1212 Encounter for screening for malignant neoplasm of rectum: Secondary | ICD-10-CM

## 2016-07-23 MED ORDER — SODIUM CHLORIDE 0.9 % IV SOLN: 500.0000 mL | INTRAVENOUS | Status: DC

## 2016-07-23 NOTE — Op Note (Signed)
[place] Patient Name: Samantha Lester Procedure Date: 07/23/2016 12:39 PM MRN: [id number] Endoscopist: Mallie Mussel L. Loletha Carrow , MD Age: 51 Referring MD:  Date of Birth: [birth year] Gender: Female Account #: [id number] Procedure:                Colonoscopy Indications:              Screening for colorectal malignant neoplasm, This                            is the patient's first colonoscopy Medicines:                Monitored Anesthesia Care Procedure:                Pre-Anesthesia Assessment:                           - Prior to the procedure, a History and Physical                            was performed, and patient medications and                            allergies were reviewed. The patient's tolerance of                            previous anesthesia was also reviewed. The risks                            and benefits of the procedure and the sedation                            options and risks were discussed with the patient.                            All questions were answered, and informed consent                            was obtained. Anticoagulants: The patient has taken                            aspirin. It was decided not to withhold this                            medication prior to the procedure. ASA Grade                            Assessment: II - A patient with mild systemic                            disease. After reviewing the risks and benefits,                            the patient was deemed in satisfactory condition to  undergo the procedure.                           After obtaining informed consent, the colonoscope                            was passed under direct vision. Throughout the                            procedure, the patient's blood pressure, pulse, and                            oxygen saturations were monitored continuously. The                            Model CF-[id number]) scope was introduced                            through the anus and advanced to the the cecum,                            identified by appendiceal orifice and ileocecal                            valve. The colonoscopy was performed without                            difficulty. The patient tolerated the procedure                            well. The quality of the bowel preparation was                            excellent. The ileocecal valve, appendiceal                            orifice, and rectum were photographed. The quality                            of the bowel preparation was evaluated using the                            BBPS [facility] Bowel Preparation Scale) with scores                            of: Right Colon = 3, Transverse Colon = 3 and Left                            Colon = 3 (entire mucosa seen well with no residual                            staining, small fragments of stool or opaque  liquid). The total BBPS score equals 9. The bowel                            preparation used was SUPREP. Scope In: [id number] PM Scope Out: 1:05:58 PM Scope Withdrawal Time: 0 hours 17 minutes 33 seconds  Total Procedure Duration: 0 hours 19 minutes 46 seconds  Findings:                 The perianal and digital rectal examinations were                            normal.                           Two sessile polyps were found in the hepatic                            flexure and ascending colon. The polyps were 2 mm                            in size. These polyps were removed with a cold                            biopsy forceps. Resection and retrieval were                            complete.                           Three sessile polyps were found in the mid sigmoid                            colon and ascending colon. The polyps were 4 mm in                            size. These polyps were removed with a cold snare.                            Resection and retrieval were  complete.                           The exam was otherwise without abnormality on                            direct and retroflexion views. Complications:            No immediate complications. Estimated Blood Loss:     Estimated blood loss: none. Impression:               - Two 2 mm polyps at the hepatic flexure and in the                            ascending colon, removed with a cold biopsy  forceps. Resected and retrieved.                           - Three 4 mm polyps in the mid sigmoid colon and in                            the ascending colon, removed with a cold snare.                            Resected and retrieved.                           - The examination was otherwise normal on direct                            and retroflexion views. Recommendation:           - Patient has a contact number available for                            emergencies. The signs and symptoms of potential                            delayed complications were discussed with the                            patient. Return to normal activities tomorrow.                            Written discharge instructions were provided to the                            patient.                           - Resume previous diet.                           - Continue present medications.                           - Await pathology results.                           - Repeat colonoscopy is recommended for                            surveillance. The colonoscopy date will be                            determined after pathology results from today's                            exam become available for review. Dane Kopke L. Loletha Carrow, MD 07/23/2016 1:14:45 PM This report has been signed electronically.

## 2016-07-23 NOTE — Progress Notes (Signed)
Report to PACU, RN, vss, BBS= Clear.  

## 2016-07-23 NOTE — Patient Instructions (Signed)
Impression/Recommendations:  [facility] handout given to patient.  Repeat colonoscopy recommended for surveillance.  Date to be determined after pathology results reviewed.  YOU HAD AN ENDOSCOPIC PROCEDURE TODAY AT THE [facility]:   Refer to the procedure report that was given to you for any specific questions about what was found during the examination.  If the procedure report does not answer your questions, please call your gastroenterologist to clarify.  If you requested that your care partner not be given the details of your procedure findings, then the procedure report has been included in a sealed envelope for you to review at your convenience later.  YOU SHOULD EXPECT: Some feelings of bloating in the abdomen. Passage of more gas than usual.  Walking can help get rid of the air that was put into your GI tract during the procedure and reduce the bloating. If you had a lower endoscopy (such as a colonoscopy or flexible sigmoidoscopy) you may notice spotting of blood in your stool or on the toilet paper. If you underwent a bowel prep for your procedure, you may not have a normal bowel movement for a few days.  Please Note:  You might notice some irritation and congestion in your nose or some drainage.  This is from the oxygen used during your procedure.  There is no need for concern and it should clear up in a day or so.  SYMPTOMS TO REPORT IMMEDIATELY:   Following lower endoscopy (colonoscopy or flexible sigmoidoscopy):  Excessive amounts of blood in the stool  Significant tenderness or worsening of abdominal pains  Swelling of the abdomen that is new, acute  Fever of 100F or higher For urgent or emergent issues, a gastroenterologist can be reached at any hour by calling [phone].   DIET:  We do recommend a small meal at first, but then you may proceed to your regular diet.  Drink plenty of fluids but you should avoid alcoholic beverages for 24 hours.  ACTIVITY:  You  should plan to take it easy for the rest of today and you should NOT DRIVE or use heavy machinery until tomorrow (because of the sedation medicines used during the test).    FOLLOW UP: Our staff will call the number listed on your records the next business day following your procedure to check on you and address any questions or concerns that you may have regarding the information given to you following your procedure. If we do not reach you, we will leave a message.  However, if you are feeling well and you are not experiencing any problems, there is no need to return our call.  We will assume that you have returned to your regular daily activities without incident.  If any biopsies were taken you will be contacted by phone or by letter within the next 1-3 weeks.  Please call us at [phone] if you have not heard about the biopsies in 3 weeks.    SIGNATURES/CONFIDENTIALITY: You and/or your care partner have signed paperwork which will be entered into your electronic medical record.  These signatures attest to the fact that that the information above on your After Visit Summary has been reviewed and is understood.  Full responsibility of the confidentiality of this discharge information lies with you and/or your care-partner. 

## 2016-07-23 NOTE — Progress Notes (Signed)
Called to room to assist during endoscopic procedure.  Patient ID and intended procedure confirmed with present staff. Received instructions for my participation in the procedure from the performing physician.  

## 2016-07-25 ENCOUNTER — Telehealth: Payer: Self-pay | Admitting: *Deleted

## 2016-07-25 NOTE — Telephone Encounter (Signed)
  Follow up Call-  Call back number 07/23/2016  Post procedure Call Back phone  # [phone]  Permission to leave phone message Yes  Some recent data might be hidden     Patient questions:  Do you have a fever, pain , or abdominal swelling? No. Pain Score  0 *  Have you tolerated food without any problems? Yes.    Have you been able to return to your normal activities? Yes.    Do you have any questions about your discharge instructions: Diet   No. Medications  No. Follow up visit  No.  Do you have questions or concerns about your Care? No.  Actions: * If pain score is 4 or above: No action needed, pain <4.

## 2016-07-30 NOTE — Progress Notes (Signed)
HPI: FU CAD. Admitted 7/16 with NSTEMI. Echo 7/16 showed normal LV function and mild MR. 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 EF 40-45. CVTS saw patient but felt CABG not indicated. Patient treated medically. FU cath 01/12/15 showed improvement of dissection with residual 75 OM1 and 80 distal Lcx. Since last seen, she denies dyspnea, chest pain, palpitations or syncope.  Current Outpatient Prescriptions  Medication Sig Dispense Refill  . atorvastatin (LIPITOR) 80 MG tablet Take 80 mg by mouth daily.    Marland Kitchen levothyroxine (SYNTHROID, LEVOTHROID) 50 MCG tablet Take 1 tablet (50 mcg total) by mouth daily. 30 tablet 0  . lisinopril (PRINIVIL,ZESTRIL) 20 MG tablet Take 1 tablet (20 mg total) by mouth daily. 90 tablet 3  . Naltrexone-Bupropion HCl ER (CONTRAVE) 8-90 MG TB12 Take 90 mg by mouth 1 day or 1 dose.    . nitroGLYCERIN (NITROSTAT) 0.4 MG SL tablet Place 1 tablet (0.4 mg total) under the tongue every 5 (five) minutes x 3 doses as needed for chest pain. 25 tablet 12  . aspirin EC 81 MG EC tablet Take 1 tablet (81 mg total) by mouth daily. (Patient not taking: Reported on 07/31/2016)    . isosorbide mononitrate (IMDUR) 60 MG 24 hr tablet Take 1 tablet (60 mg total) by mouth daily. (Patient not taking: Reported on 07/31/2016) 90 tablet 3   Current Facility-Administered Medications  Medication Dose Route Frequency Provider Last Rate Last Dose  . 0.9 %  sodium chloride infusion  500 mL Intravenous Continuous Doran Stabler, MD         Past Medical History:  Diagnosis Date  . Anxiety   . Coronary artery dissection   . GERD (gastroesophageal reflux disease)   . Heart attack 2016  . Hyperlipidemia   . Hypertension   . Thyroid disease     Past Surgical History:  Procedure Laterality Date  . CARDIAC CATHETERIZATION N/A 01/09/2015   Procedure: Left Heart Cath and Coronary Angiography;  Surgeon: Peter M Martinique, MD;   Location: [facility];  Service: Cardiovascular;  Laterality: N/A;  . CARDIAC CATHETERIZATION N/A 01/12/2015   Procedure: Left Heart Cath and Coronary Angiography;  Surgeon: Sherren Mocha, MD;  Location: [facility];  Service: Cardiovascular;  Laterality: N/A;  . TUBAL LIGATION     had reversal 1998  . tubal ligation reversal  1998    Social History   Social History  . Marital status: Married    Spouse name: N/A  . Number of children: N/A  . Years of education: N/A   Occupational History  . Not on file.   Social History Main Topics  . Smoking status: Never Smoker  . Smokeless tobacco: Never Used  . Alcohol use 0.0 oz/week     Comment: occasionally   . Drug use: No  . Sexual activity: Yes    Partners: Male    Birth control/ protection: Post-menopausal   Other Topics Concern  . Not on file   Social History Narrative  . No narrative on file    Family History  Problem Relation Age of Onset  . Hypertension Mother   . Healthy Father   . Kidney disease Sister   . Hypertension Sister   . Healthy Sister   . Drug abuse Brother   . Alcohol abuse Brother   . Hypertension Brother   . Colon cancer Neg Hx  ROS: no fevers or chills, productive cough, hemoptysis, dysphasia, odynophagia, melena, hematochezia, dysuria, hematuria, rash, seizure activity, orthopnea, PND, pedal edema, claudication. Remaining systems are negative.  Physical Exam: Well-developed obese in no acute distress.  Skin is warm and dry.  HEENT is normal.  Neck is supple. No bruits Chest is clear to auscultation with normal expansion.  Cardiovascular exam is regular rate and rhythm.  Abdominal exam nontender or distended. No masses palpated. Extremities show no edema. neuro grossly intact  ECG-Sinus rhythm at a rate of 74. No ST changes.  A/P  1 coronary artery dissection-continue aspirin and statin. No recurrent symptoms.  2 hypertension-blood pressure is elevated. Increase  lisinopril to 40 mg daily and check potassium and renal function in 1 week.  3 ischemic cardiomyopathy-mildly decreased LV function previously. Continue ACE inhibitor. She stopped her Coreg and she thought it might be causing headaches but also stopped Imdur. This was likely the culprit and we will continue off of isosorbide. Resume carvedilol 3.125 mg twice a day to see if she tolerates.   4 hyperlipidemia-continue statin. Check lipids and liver.   Kirk Ruths, MD

## 2016-07-31 ENCOUNTER — Encounter: Payer: Self-pay | Admitting: Cardiology

## 2016-07-31 ENCOUNTER — Ambulatory Visit (INDEPENDENT_AMBULATORY_CARE_PROVIDER_SITE_OTHER): Payer: 59 | Admitting: Cardiology

## 2016-07-31 VITALS — BP 153/89 | HR 74 | Ht 60.0 in | Wt 188.8 lb

## 2016-07-31 DIAGNOSIS — E7800 Pure hypercholesterolemia, unspecified: Secondary | ICD-10-CM

## 2016-07-31 DIAGNOSIS — I10 Essential (primary) hypertension: Secondary | ICD-10-CM | POA: Diagnosis not present

## 2016-07-31 DIAGNOSIS — I2510 Atherosclerotic heart disease of native coronary artery without angina pectoris: Secondary | ICD-10-CM | POA: Diagnosis not present

## 2016-07-31 MED ORDER — CARVEDILOL 3.125 MG PO TABS: 3.1250 mg | ORAL_TABLET | Freq: Two times a day (BID) | ORAL | 3 refills | Status: DC

## 2016-07-31 MED ORDER — LISINOPRIL 40 MG PO TABS: 40.0000 mg | ORAL_TABLET | Freq: Every day | ORAL | 3 refills | Status: DC

## 2016-07-31 MED FILL — LISINOPRIL 40 MG TABLET: 40 | 90 days supply | Qty: 90 | Fill #0

## 2016-07-31 MED FILL — CARVEDILOL 3.125 MG TABLET: 3.125 | 90 days supply | Qty: 180 | Fill #0

## 2016-07-31 NOTE — Patient Instructions (Signed)
Medication Instructions:   STOP ISOSORBIDE  INCREASE LISINOPRIL TO 40 MG ONCE DAILY- 2 OF THE 20 MG TABLETS ONCE DAILY  START CARVEDILOL 3.125 MG TWICE DAILY  Labwork:  Your physician recommends that you return for lab work in: ONE WEEK=FASTING  Follow-Up:  Your physician wants you to follow-up in: [place] will receive a reminder letter in the mail two months in advance. If you don't receive a letter, please call our office to schedule the follow-up appointment.   If you need a refill on your cardiac medications before your next appointment, please call your pharmacy.

## 2016-08-01 ENCOUNTER — Telehealth: Payer: Self-pay | Admitting: Pharmacist Clinician (PhC)/ Clinical Pharmacy Specialist

## 2016-08-01 MED FILL — CONTRAVE ER 8-90 MG TABLET: 8-90 | 30 days supply | Qty: 120 | Fill #2

## 2016-08-01 MED FILL — LEVOTHYROXINE 50 MCG TABLET: 50 | 30 days supply | Qty: 30 | Fill #1

## 2016-08-01 NOTE — Telephone Encounter (Signed)
LMOM to discuss possible [facility] 10 study inclusion.  Will need current lipid labs, LDL would need to be > 70.

## 2016-08-03 ENCOUNTER — Encounter: Payer: Self-pay | Admitting: Gastroenterology

## 2016-08-06 NOTE — Telephone Encounter (Signed)
Spoke with patient, she is willing to join [facility].  Has not had lipid labs in almost 2 years.  She states tried to go this AM but was not fasting, so will try again on Friday.  Once we get those results I will call to let her know if Research will be contacting her.   Patient voiced understanding.

## 2016-08-08 DIAGNOSIS — E7800 Pure hypercholesterolemia, unspecified: Secondary | ICD-10-CM | POA: Diagnosis not present

## 2016-08-08 DIAGNOSIS — E038 Other specified hypothyroidism: Secondary | ICD-10-CM | POA: Diagnosis not present

## 2016-08-19 IMAGING — MR MR ANKLE*R* W/O CM
4 of 7 series · 19 of 40 positions shown · non-contrast
Comparison: 06/10/2005

CLINICAL DATA: Include midfoot pain, chronic.  Tibialis tendinitis.

EXAM:
MRI OF THE RIGHT ANKLE WITHOUT CONTRAST
TECHNIQUE: Multiplanar, multisequence MR imaging of the ankle was performed. No
intravenous contrast was administered.

[Series 6: PD fat-sat · axial · 3.0mm · 0.33mm/px · z∈[-84,+38]mm · 7 of 36 slices shown]
[im 1/36]
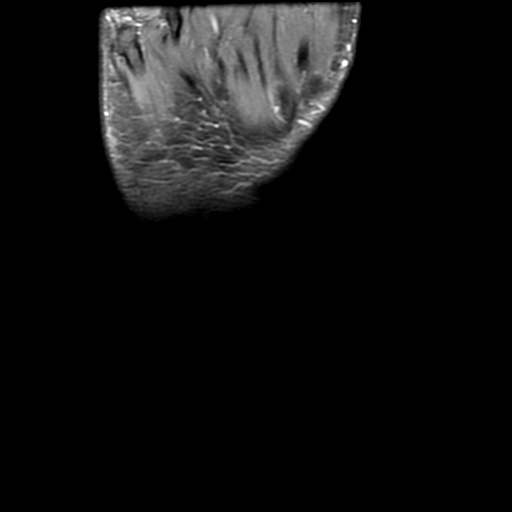
[im 6/36]
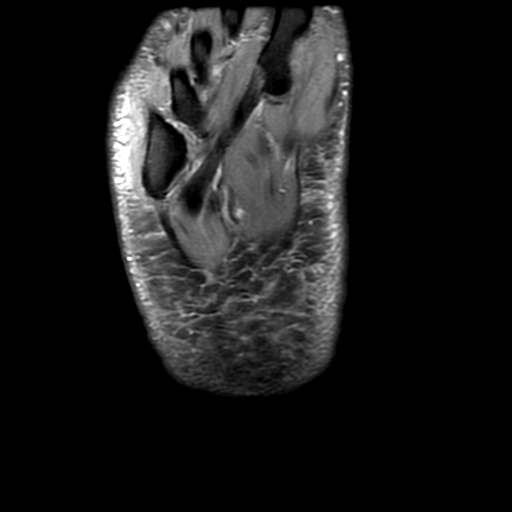
[im 12/36]
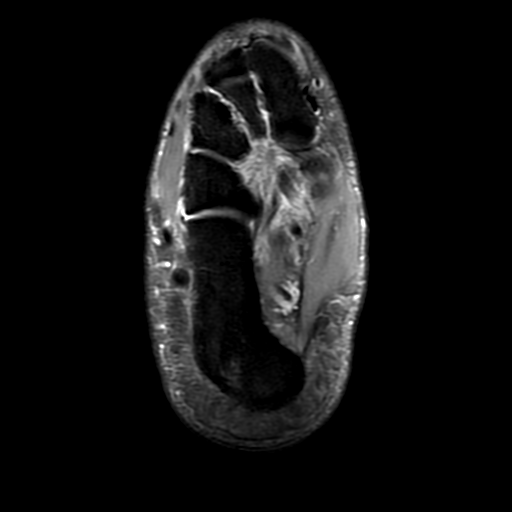
[im 18/36]
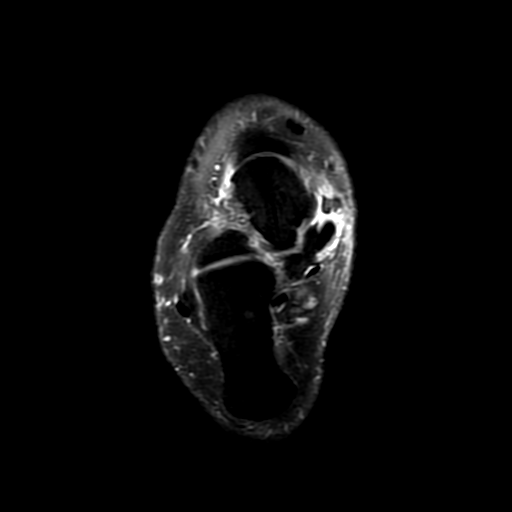
[im 24/36]
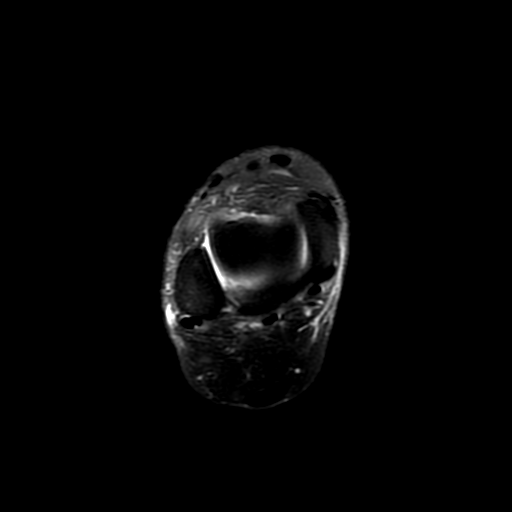
[im 30/36]
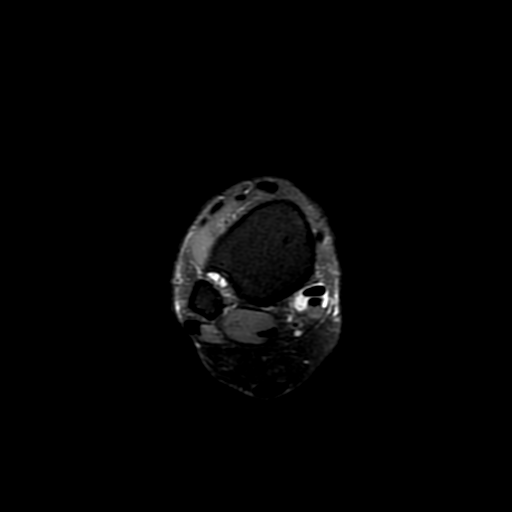
[im 36/36]
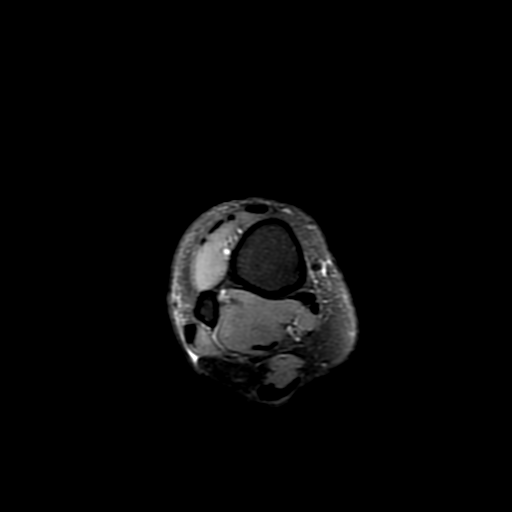

[Series 7: T2 fat-sat · axial · 3.0mm · 0.33mm/px · z∈[-67,+17]mm · 3 of 36 slices shown (1 of 2)]
[im 6/36]
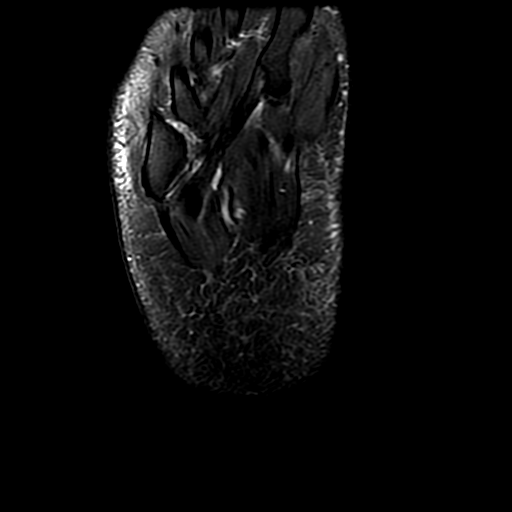
[im 18/36]
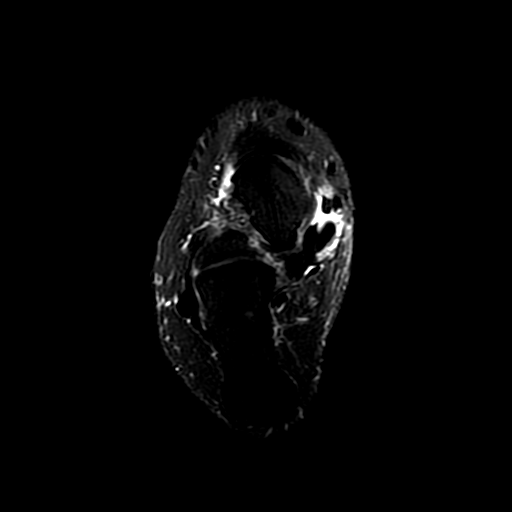
[im 30/36]
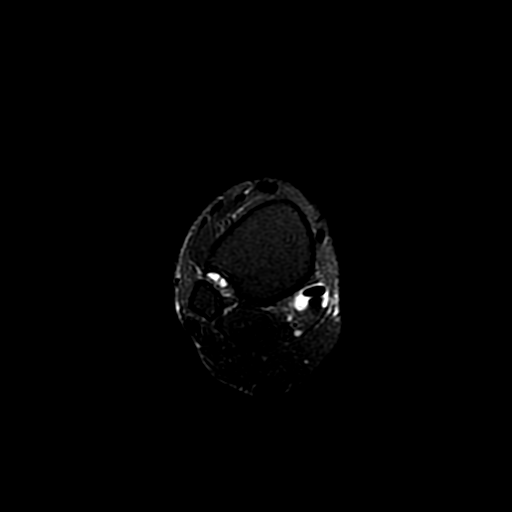

[Series 8: PD · axial · 3.0mm · 0.33mm/px · z∈[-84,+17]mm · 6 of 36 slices shown]
[im 1/36]
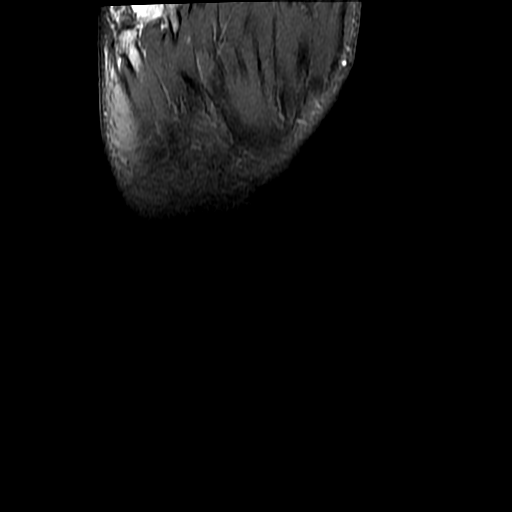
[im 6/36]
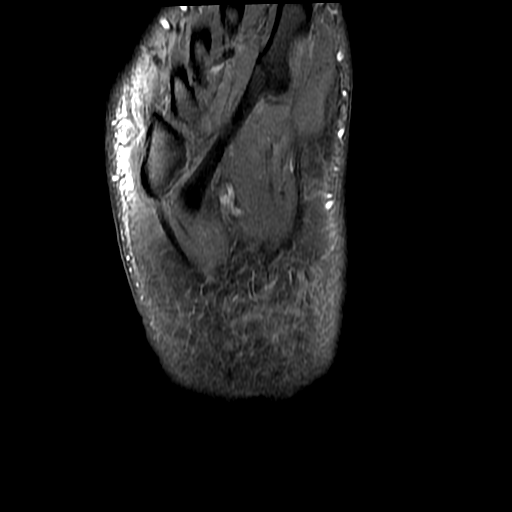
[im 12/36]
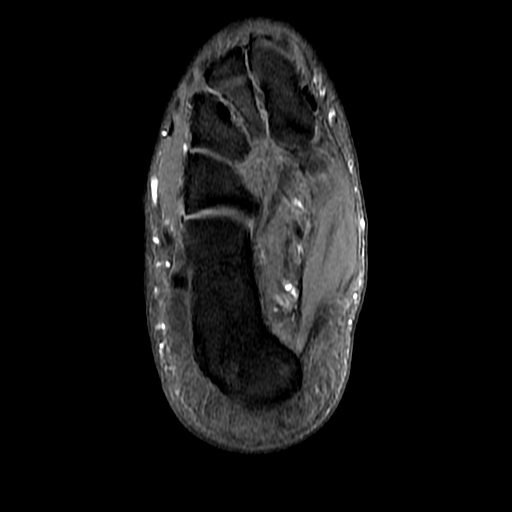
[im 18/36]
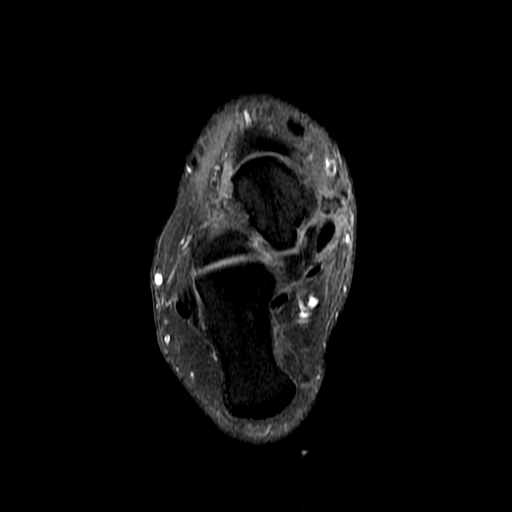
[im 24/36]
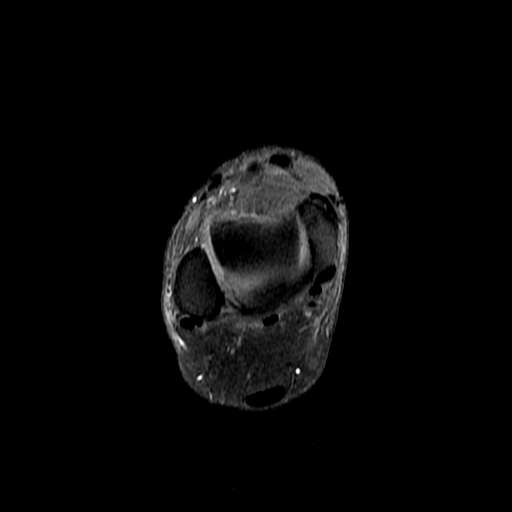
[im 30/36]
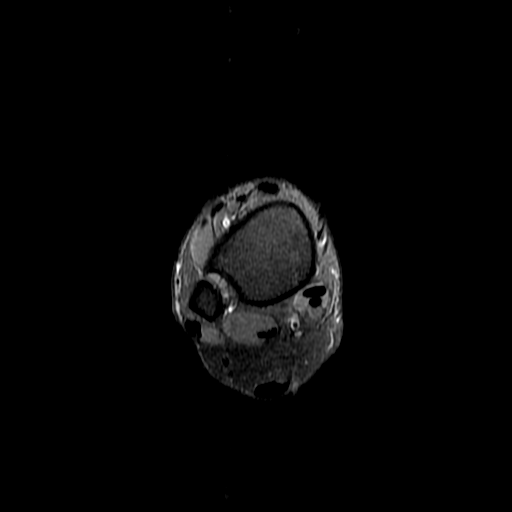

[Series 9: T2 fat-sat · coronal · 3.0mm · 0.27mm/px · 3 of 38 slices shown (2 of 2)]
[im 7/38]
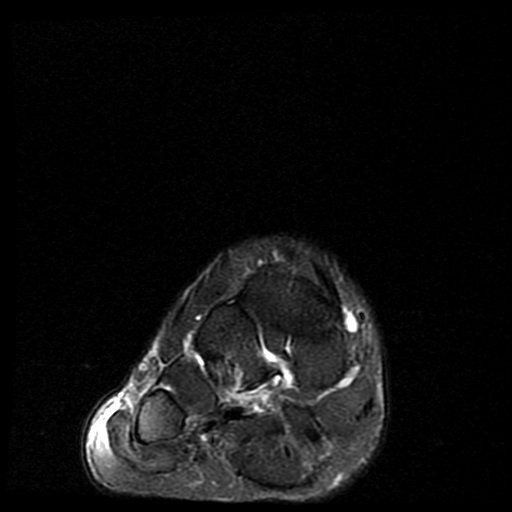
[im 19/38]
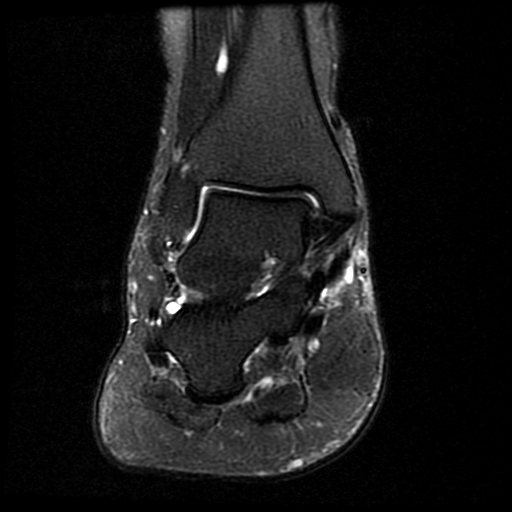
[im 31/38]
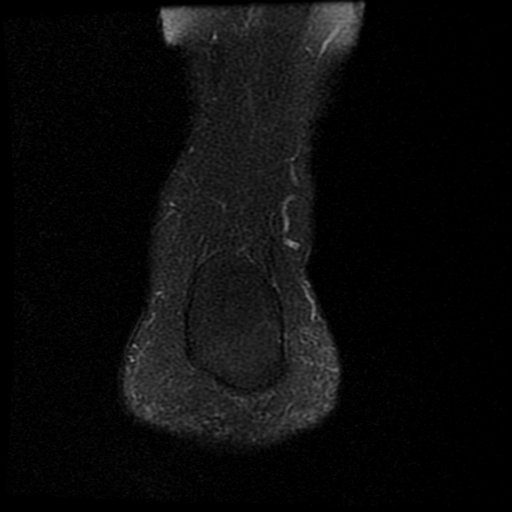

[19 of 40 positions shown; findings below may reference images not displayed]

FINDINGS: TENDONS

Peroneal: Mild peroneus longus tendinopathy adjacent to the distal
calcaneus.

Posteromedial: Tibialis posterior tenosynovitis and moderate distal
tendinopathy with expansion and increased signal in the distal
tendon on images 23 through 25 series 7 and on image [DATE]. Compare to
the prior exam of 06/10/2005 the appearance has worsened.

Anterior: Unremarkable

Achilles: Unremarkable

Plantar Fascia: There is abnormal edema in the proximal medial band
of the plantar fascia adjacent to the plantar calcaneal spur, images
11-13 series 9, compatible with plantar fasciitis.

LIGAMENTS

Lateral: Chronically thickened calcaneofibular ligament, otherwise
normal.

Medial: Chronic tear of the superomedial portion of the spring
ligament anteriorly shown on image [DATE] and also on images 23-25
series 9. Thickened appearance of the medioplantar oblique ligament.
Lisfranc ligament intact.

CARTILAGE

Ankle Joint: Unremarkable

Subtalar Joints/Sinus Tarsi: Unremarkable

Bones: Unremarkable

Other: No supplemental non-categorized findings.
IMPRESSION: 1. Moderate distal tibialis posterior tendinopathy with associated
tenosynovitis, worsened compared to the 7669 exam, correlate
clinically in assessing for tibialis posterior dysfunction. Of note,
there is a large tear of the adjacent superomedial portion of the
spring ligament. The medioplantar oblique portion of the spring
ligament is thickened.
2. Mild peroneus longus tendinopathy.
3. Chronically thickened calcaneofibular ligament, query remote
injury.
4. Plantar fasciitis.

## 2016-09-09 MED FILL — CONTRAVE ER 8-90 MG TABLET: 8-90 | 30 days supply | Qty: 120 | Fill #0

## 2016-09-09 MED FILL — LEVOTHYROXINE 50 MCG TABLET: 50 | 30 days supply | Qty: 30 | Fill #2

## 2016-10-02 MED FILL — traMADol HCL 50 MG TABS: 50 | 2 days supply | Qty: 10 | Fill #0

## 2016-10-02 MED FILL — ATORVASTATIN 80 MG TABLET: 80 | 90 days supply | Qty: 90 | Fill #1

## 2016-10-02 MED FILL — IBUPROFEN 600 MG TABLET: 600 | 4 days supply | Qty: 16 | Fill #0

## 2016-10-14 MED FILL — LEVOTHYROXINE 50 MCG TABLET: 50 | 90 days supply | Qty: 90 | Fill #3

## 2016-11-12 MED FILL — LISINOPRIL 40 MG TABLET: 40 | 90 days supply | Qty: 90 | Fill #1

## 2017-01-14 ENCOUNTER — Other Ambulatory Visit: Payer: Self-pay | Admitting: Cardiology

## 2017-01-14 MED FILL — LEVOTHYROXINE 50 MCG TABLET: 50 | 90 days supply | Qty: 90 | Fill #4

## 2017-01-14 MED FILL — ATORVASTATIN 80 MG TABLET: 80 | 90 days supply | Qty: 90 | Fill #0

## 2017-01-14 MED FILL — CONTRAVE ER 8-90 MG TABLET: 8-90 | 30 days supply | Qty: 120 | Fill #0

## 2017-02-27 MED FILL — LISINOPRIL 40 MG TABLET: 40 | 90 days supply | Qty: 90 | Fill #2

## 2017-03-17 DIAGNOSIS — R42 Dizziness and giddiness: Secondary | ICD-10-CM | POA: Diagnosis not present

## 2017-03-17 DIAGNOSIS — E038 Other specified hypothyroidism: Secondary | ICD-10-CM | POA: Diagnosis not present

## 2017-03-17 DIAGNOSIS — I10 Essential (primary) hypertension: Secondary | ICD-10-CM | POA: Diagnosis not present

## 2017-03-17 DIAGNOSIS — Z6835 Body mass index (BMI) 35.0-35.9, adult: Secondary | ICD-10-CM | POA: Diagnosis not present

## 2017-03-24 DIAGNOSIS — R42 Dizziness and giddiness: Secondary | ICD-10-CM | POA: Diagnosis not present

## 2017-03-24 DIAGNOSIS — F418 Other specified anxiety disorders: Secondary | ICD-10-CM | POA: Diagnosis not present

## 2017-03-24 DIAGNOSIS — Z6835 Body mass index (BMI) 35.0-35.9, adult: Secondary | ICD-10-CM | POA: Diagnosis not present

## 2017-03-24 DIAGNOSIS — I10 Essential (primary) hypertension: Secondary | ICD-10-CM | POA: Diagnosis not present

## 2017-03-24 MED FILL — TELMISARTAN 20 MG TABS: 20 | 30 days supply | Qty: 30 | Fill #0

## 2017-03-24 MED FILL — ESCITALOPRAM 10 MG TABLET: 10 | 30 days supply | Qty: 30 | Fill #0

## 2017-04-07 DIAGNOSIS — B373 Candidiasis of vulva and vagina: Secondary | ICD-10-CM | POA: Diagnosis not present

## 2017-04-07 DIAGNOSIS — F418 Other specified anxiety disorders: Secondary | ICD-10-CM | POA: Diagnosis not present

## 2017-04-07 DIAGNOSIS — R509 Fever, unspecified: Secondary | ICD-10-CM | POA: Diagnosis not present

## 2017-04-07 DIAGNOSIS — J020 Streptococcal pharyngitis: Secondary | ICD-10-CM | POA: Diagnosis not present

## 2017-04-07 DIAGNOSIS — J028 Acute pharyngitis due to other specified organisms: Secondary | ICD-10-CM | POA: Diagnosis not present

## 2017-04-07 DIAGNOSIS — I10 Essential (primary) hypertension: Secondary | ICD-10-CM | POA: Diagnosis not present

## 2017-04-07 DIAGNOSIS — Z6834 Body mass index (BMI) 34.0-34.9, adult: Secondary | ICD-10-CM | POA: Diagnosis not present

## 2017-04-07 MED FILL — FLUCONAZOLE 150 MG TABLET: 150 | 3 days supply | Qty: 2 | Fill #0

## 2017-04-07 MED FILL — CEFDINIR 300 MG CAPSULE: 300 | 10 days supply | Qty: 20 | Fill #0

## 2017-04-22 MED FILL — LEVOTHYROXINE 50 MCG TABLET: 50 | 90 days supply | Qty: 90 | Fill #5

## 2017-04-22 MED FILL — ESCITALOPRAM 10 MG TABLET: 10 | 30 days supply | Qty: 30 | Fill #1

## 2017-04-22 MED FILL — ATORVASTATIN 80 MG TABLET: 80 | 90 days supply | Qty: 90 | Fill #1

## 2017-04-22 MED FILL — TELMISARTAN 20 MG TABS: 20 | 30 days supply | Qty: 30 | Fill #1

## 2017-05-13 ENCOUNTER — Ambulatory Visit (INDEPENDENT_AMBULATORY_CARE_PROVIDER_SITE_OTHER): Payer: 59 | Admitting: Obstetrics and Gynecology

## 2017-05-13 ENCOUNTER — Encounter: Payer: Self-pay | Admitting: Obstetrics and Gynecology

## 2017-05-13 VITALS — BP 138/80 | HR 66 | Resp 16 | Ht 60.5 in | Wt 187.0 lb

## 2017-05-13 DIAGNOSIS — Z01419 Encounter for gynecological examination (general) (routine) without abnormal findings: Secondary | ICD-10-CM

## 2017-05-13 DIAGNOSIS — E559 Vitamin D deficiency, unspecified: Secondary | ICD-10-CM

## 2017-05-13 NOTE — Progress Notes (Signed)
51 y.o. [id number] MarriedCaucasianF here for annual exam.  She had a bad yeast infection a few months ago along with an allergy to toilet paper. Improved with diflucan. She started having pain with intercourse, got better with lubricant. She has a low libido, but is on an SSRI.  No vaginal bleeding.     Patient's last menstrual period was 07/07/1997 (approximate).          Sexually active: Yes.    The current method of family planning is post menopausal status.    Exercising: Yes.    walking, dance, jog Smoker:  no  Health Maintenance: Pap:  05/07/16 Neg. HR HPV:neg  History of abnormal Pap:  no MMG:  06/05/16 [place] Right BIRADS2:benign  Colonoscopy:  07/23/16 polyps. Was told f/u 10 years BMD:   05/21/16 Osteopenia. Frax risks: 2.3% and 0.2% TDaP: 2016    reports that  has never smoked. she has never used smokeless tobacco. She reports that she drinks alcohol. She reports that she does not use drugs. She is a Passenger transport manager at [employer]. She has 2 kids (11 and 73), she is raising her 35 and 32 year old grandchildren. Daughter has another baby that she is raising. Daughter has borderline personality disorder.   Past Medical History:  Diagnosis Date  . Anxiety   . Coronary artery dissection   . GERD (gastroesophageal reflux disease)   . Heart attack ([place]) 2016  . Hyperlipidemia   . Hypertension   . Thyroid disease     Past Surgical History:  Procedure Laterality Date  . TUBAL LIGATION     had reversal 1998  . tubal ligation reversal  1998    Current Outpatient Medications  Medication Sig Dispense Refill  . aspirin EC 81 MG EC tablet Take 1 tablet (81 mg total) by mouth daily.    Marland Kitchen atorvastatin (LIPITOR) 80 MG tablet TAKE 1 TABLET (80 MG TOTAL) BY MOUTH DAILY. 90 tablet 2  . escitalopram (LEXAPRO) 10 MG tablet Take 1 tablet daily by mouth.  1  . levothyroxine (SYNTHROID, LEVOTHROID) 50 MCG tablet Take 1 tablet (50 mcg total) by mouth daily. 30 tablet 0  . lisinopril  (PRINIVIL,ZESTRIL) 40 MG tablet Take 1 tablet (40 mg total) by mouth daily. 90 tablet 3  . Melatonin 1 MG CAPS Take daily by mouth.    . telmisartan (MICARDIS) 20 MG tablet Take 1 tablet daily by mouth.  2  . nitroGLYCERIN (NITROSTAT) 0.4 MG SL tablet Place 1 tablet (0.4 mg total) under the tongue every 5 (five) minutes x 3 doses as needed for chest pain. (Patient not taking: Reported on 05/13/2017) 25 tablet 12   No current facility-administered medications for this visit.     Family History  Problem Relation Age of Onset  . Hypertension Mother   . Healthy Father   . Kidney disease Sister   . Hypertension Sister   . Healthy Sister   . Drug abuse Brother   . Alcohol abuse Brother   . Hypertension Brother   . Colon cancer Neg Hx     Review of Systems  Constitutional: Negative.   HENT: Negative.   Eyes: Negative.   Respiratory: Negative.   Cardiovascular: Negative.   Gastrointestinal: Negative.   Endocrine: Negative.   Genitourinary: Negative.   Musculoskeletal: Negative.   Skin: Negative.   Allergic/Immunologic: Negative.   Neurological: Negative.   Hematological: Negative.   Psychiatric/Behavioral: Negative.     Exam:   BP 138/80 (BP Location:  Right Arm, Patient Position: Sitting, Cuff Size: Large)   Pulse 66   Resp 16   Ht 5' 0.5" (1.537 m)   Wt 187 lb (84.8 kg)   LMP 07/07/1997 (Approximate)   BMI 35.92 kg/m   Weight change: @WEIGHTCHANGE @ Height:   Height: 5' 0.5" (153.7 cm)  Ht Readings from Last 3 Encounters:  05/13/17 5' 0.5" (1.537 m)  07/31/16 5' (1.524 m)  07/23/16 5' (1.524 m)    General appearance: alert, cooperative and appears stated age Head: Normocephalic, without obvious abnormality, atraumatic Neck: no adenopathy, supple, symmetrical, trachea midline and thyroid normal to inspection and palpation Lungs: clear to auscultation bilaterally Cardiovascular: regular rate and rhythm Breasts: normal appearance, no masses or tenderness Abdomen: soft,  non-tender; non distended,  no masses,  no organomegaly Extremities: extremities normal, atraumatic, no cyanosis or edema Skin: Skin color, texture, turgor normal. No rashes or lesions Lymph nodes: Cervical, supraclavicular, and axillary nodes normal. No abnormal inguinal nodes palpated Neurologic: Grossly normal   Pelvic: External genitalia:  no lesions              Urethra:  normal appearing urethra with no masses, tenderness or lesions              Bartholins and Skenes: normal                 Vagina: normal appearing vagina with mild atrophy, not restricted.               Cervix: no lesions               Bimanual Exam:  Uterus:  normal size, contour, position, consistency, mobility, non-tender              Adnexa: no mass, fullness, tenderness               Rectovaginal: Confirms               Anus:  normal sphincter tone, no lesions  Chaperone was present for exam.  A:  Well Woman with normal exam  H/O vit d def  H/O cardiac disease, will f/u with her primary  Osteopenia, still with low risk of fracture.  P:   No pap this year  Vit D level  She will get the rest of her blood work with her primary  Discussed breast self exam  Discussed calcium and vit D intake

## 2017-05-13 NOTE — Patient Instructions (Signed)

## 2017-05-14 LAB — VITAMIN D 25 HYDROXY (VIT D DEFICIENCY, FRACTURES): Vit D, 25-Hydroxy: 19.8 ng/mL — ABNORMAL LOW (ref 30.0–100.0)

## 2017-05-21 MED FILL — TELMISARTAN 20 MG TABLET: 20 | 90 days supply | Qty: 90 | Fill #0

## 2017-06-03 DIAGNOSIS — R8290 Unspecified abnormal findings in urine: Secondary | ICD-10-CM | POA: Diagnosis not present

## 2017-06-03 DIAGNOSIS — Z0000 Encounter for general adult medical examination without abnormal findings: Secondary | ICD-10-CM | POA: Diagnosis not present

## 2017-06-03 DIAGNOSIS — I10 Essential (primary) hypertension: Secondary | ICD-10-CM | POA: Diagnosis not present

## 2017-06-03 DIAGNOSIS — E038 Other specified hypothyroidism: Secondary | ICD-10-CM | POA: Diagnosis not present

## 2017-06-09 MED FILL — LISINOPRIL 40 MG TABLET: 40 | 90 days supply | Qty: 90 | Fill #3

## 2017-06-09 MED FILL — ESCITALOPRAM 10 MG TABLET: 10 | 90 days supply | Qty: 90 | Fill #0

## 2017-06-10 DIAGNOSIS — F418 Other specified anxiety disorders: Secondary | ICD-10-CM | POA: Diagnosis not present

## 2017-06-10 DIAGNOSIS — I119 Hypertensive heart disease without heart failure: Secondary | ICD-10-CM | POA: Diagnosis not present

## 2017-06-10 DIAGNOSIS — E668 Other obesity: Secondary | ICD-10-CM | POA: Diagnosis not present

## 2017-06-10 DIAGNOSIS — E7800 Pure hypercholesterolemia, unspecified: Secondary | ICD-10-CM | POA: Diagnosis not present

## 2017-06-10 DIAGNOSIS — E038 Other specified hypothyroidism: Secondary | ICD-10-CM | POA: Diagnosis not present

## 2017-06-10 DIAGNOSIS — Z0000 Encounter for general adult medical examination without abnormal findings: Secondary | ICD-10-CM | POA: Diagnosis not present

## 2017-06-10 DIAGNOSIS — Z9861 Coronary angioplasty status: Secondary | ICD-10-CM | POA: Diagnosis not present

## 2017-06-10 DIAGNOSIS — I2510 Atherosclerotic heart disease of native coronary artery without angina pectoris: Secondary | ICD-10-CM | POA: Diagnosis not present

## 2017-06-10 DIAGNOSIS — I10 Essential (primary) hypertension: Secondary | ICD-10-CM | POA: Diagnosis not present

## 2017-06-10 DIAGNOSIS — Z1389 Encounter for screening for other disorder: Secondary | ICD-10-CM | POA: Diagnosis not present

## 2017-06-17 DIAGNOSIS — Z1212 Encounter for screening for malignant neoplasm of rectum: Secondary | ICD-10-CM | POA: Diagnosis not present

## 2017-07-23 ENCOUNTER — Other Ambulatory Visit: Payer: Self-pay | Admitting: Obstetrics and Gynecology

## 2017-07-23 DIAGNOSIS — Z1231 Encounter for screening mammogram for malignant neoplasm of breast: Secondary | ICD-10-CM

## 2017-07-28 ENCOUNTER — Ambulatory Visit
Admission: RE | Admit: 2017-07-28 | Discharge: 2017-07-28 | Disposition: A | Discharge status: Home / Self Care | Payer: 59 | Source: Ambulatory Visit | Attending: Obstetrics and Gynecology | Admitting: Obstetrics and Gynecology

## 2017-07-28 DIAGNOSIS — Z1231 Encounter for screening mammogram for malignant neoplasm of breast: Secondary | ICD-10-CM

## 2017-08-18 ENCOUNTER — Other Ambulatory Visit (INDEPENDENT_AMBULATORY_CARE_PROVIDER_SITE_OTHER): Payer: No Typology Code available for payment source

## 2017-08-18 DIAGNOSIS — E559 Vitamin D deficiency, unspecified: Secondary | ICD-10-CM

## 2017-08-19 LAB — VITAMIN D 25 HYDROXY (VIT D DEFICIENCY, FRACTURES): Vit D, 25-Hydroxy: 39.5 ng/mL (ref 30.0–100.0)

## 2017-08-25 MED FILL — LEVOTHYROXINE 50 MCG TABLET: 50 | 90 days supply | Qty: 90 | Fill #0

## 2017-09-02 ENCOUNTER — Encounter (INDEPENDENT_AMBULATORY_CARE_PROVIDER_SITE_OTHER): Payer: No Typology Code available for payment source

## 2017-09-15 ENCOUNTER — Ambulatory Visit (INDEPENDENT_AMBULATORY_CARE_PROVIDER_SITE_OTHER): Payer: No Typology Code available for payment source | Admitting: Family Medicine

## 2017-09-17 MED FILL — ATORVASTATIN 20 MG TABLET: 20 | 90 days supply | Qty: 90 | Fill #0

## 2017-09-17 MED FILL — TELMISARTAN 20 MG TABLET: 20 | 90 days supply | Qty: 90 | Fill #1

## 2017-09-29 DIAGNOSIS — H02839 Dermatochalasis of unspecified eye, unspecified eyelid: Secondary | ICD-10-CM | POA: Insufficient documentation

## 2017-09-29 DIAGNOSIS — M898X8 Other specified disorders of bone, other site: Secondary | ICD-10-CM | POA: Insufficient documentation

## 2017-10-15 ENCOUNTER — Other Ambulatory Visit: Payer: Self-pay | Admitting: Cardiology

## 2017-10-15 DIAGNOSIS — I10 Essential (primary) hypertension: Secondary | ICD-10-CM

## 2017-10-15 MED FILL — ESCITALOPRAM 10 MG TABLET: 10 | 90 days supply | Qty: 90 | Fill #1

## 2017-10-15 MED FILL — LISINOPRIL 40 MG TABLET: 40 | 90 days supply | Qty: 90 | Fill #0

## 2017-10-15 NOTE — Telephone Encounter (Signed)
REFILL 

## 2017-11-03 NOTE — Progress Notes (Signed)
HPI: FU CAD. Admitted 7/16 with NSTEMI. Echo 7/16 showed normal LV function and mild MR. 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 EF 40-45. CVTS saw patient but felt CABG not indicated. Patient treated medically. FU cath 01/12/15 showed improvement of dissection with residual 75 OM1 and 80 distal Lcx. Since last seen,  patient denies dyspnea, chest pain similar to infarct pain, palpitations or syncope.  She has occasional pain in her left upper chest not related to activities.  Current Outpatient Medications  Medication Sig Dispense Refill  . aspirin EC 81 MG EC tablet Take 1 tablet (81 mg total) by mouth daily. (Patient taking differently: Take 81 mg by mouth. )    . atorvastatin (LIPITOR) 80 MG tablet TAKE 1 TABLET (80 MG TOTAL) BY MOUTH DAILY. 90 tablet 2  . escitalopram (LEXAPRO) 10 MG tablet Take 1 tablet daily by mouth.  1  . levothyroxine (SYNTHROID, LEVOTHROID) 50 MCG tablet Take 1 tablet (50 mcg total) by mouth daily. 30 tablet 0  . lisinopril (PRINIVIL,ZESTRIL) 40 MG tablet Take 1 tablet (40 mg total) by mouth daily. KEEP OV. 90 tablet 0  . Melatonin 1 MG CAPS Take daily by mouth.    . nitroGLYCERIN (NITROSTAT) 0.4 MG SL tablet Place 1 tablet (0.4 mg total) under the tongue every 5 (five) minutes x 3 doses as needed for chest pain. 25 tablet 12  . telmisartan (MICARDIS) 20 MG tablet Take 1 tablet daily by mouth.  2   No current facility-administered medications for this visit.      Past Medical History:  Diagnosis Date  . Anxiety   . Coronary artery dissection   . GERD (gastroesophageal reflux disease)   . Heart attack ([place]) 2016  . Hyperlipidemia   . Hypertension   . Thyroid disease     Past Surgical History:  Procedure Laterality Date  . CARDIAC CATHETERIZATION N/A 01/09/2015   Procedure: Left Heart Cath and Coronary Angiography;  Surgeon: Peter M Martinique, MD;  Location: [facility];  Service:  Cardiovascular;  Laterality: N/A;  . CARDIAC CATHETERIZATION N/A 01/12/2015   Procedure: Left Heart Cath and Coronary Angiography;  Surgeon: Sherren Mocha, MD;  Location: [facility];  Service: Cardiovascular;  Laterality: N/A;  . TUBAL LIGATION     had reversal 1998  . tubal ligation reversal  1998    Social History   Socioeconomic History  . Marital status: Married    Spouse name: Not on file  . Number of children: Not on file  . Years of education: Not on file  . Highest education level: Not on file  Occupational History  . Not on file  Social Needs  . Financial resource strain: Not on file  . Food insecurity:    Worry: Not on file    Inability: Not on file  . Transportation needs:    Medical: Not on file    Non-medical: Not on file  Tobacco Use  . Smoking status: Never Smoker  . Smokeless tobacco: Never Used  Substance and Sexual Activity  . Alcohol use: Yes    Alcohol/week: 0.0 oz    Comment: occasionally   . Drug use: No  . Sexual activity: Yes    Partners: Male    Birth control/protection: Post-menopausal  Lifestyle  . Physical activity:    Days per week: Not on file    Minutes per session: Not on file  .  Stress: Not on file  Relationships  . Social connections:    Talks on phone: Not on file    Gets together: Not on file    Attends religious service: Not on file    Active member of club or organization: Not on file    Attends meetings of clubs or organizations: Not on file    Relationship status: Not on file  . Intimate partner violence:    Fear of current or ex partner: Not on file    Emotionally abused: Not on file    Physically abused: Not on file    Forced sexual activity: Not on file  Other Topics Concern  . Not on file  Social History Narrative  . Not on file    Family History  Problem Relation Age of Onset  . Hypertension Mother   . Healthy Father   . Kidney disease Sister   . Hypertension Sister   . Healthy Sister   . Drug  abuse Brother   . Alcohol abuse Brother   . Hypertension Brother   . Colon cancer Neg Hx     ROS: no fevers or chills, productive cough, hemoptysis, dysphasia, odynophagia, melena, hematochezia, dysuria, hematuria, rash, seizure activity, orthopnea, PND, pedal edema, claudication. Remaining systems are negative.  Physical Exam: Well-developed well-nourished in no acute distress.  Skin is warm and dry.  HEENT is normal.  Neck is supple.  Chest is clear to auscultation with normal expansion.  Cardiovascular exam is regular rate and rhythm.  Abdominal exam nontender or distended. No masses palpated. Extremities show no edema. neuro grossly intact  ECG-normal sinus rhythm at a rate of 68.  No ST changes.  Personally reviewed  A/P  1 prior coronary artery dissection-patient is not having chest pain.  Plan to continue medical therapy including aspirin and statin.  2 hypertension-blood pressure is controlled today.  However she is taking both an ARB and an ACE inhibitor.  Continue lisinopril.  Discontinue Micardis.  Add amlodipine 5 mg daily.  She did not tolerate beta-blockade.  Follow blood pressure and adjust regimen as needed.  3 hyperlipidemia-continue statin.  Check lipids and liver.  4 ischemic cardiomyopathy- plan to continue ACE inhibitor; did not tolerate beta-blocker.  Kirk Ruths, MD

## 2017-11-05 ENCOUNTER — Ambulatory Visit: Payer: No Typology Code available for payment source | Admitting: Cardiology

## 2017-11-05 ENCOUNTER — Encounter: Payer: Self-pay | Admitting: Cardiology

## 2017-11-05 VITALS — BP 126/80 | HR 68 | Ht 61.0 in | Wt 172.8 lb

## 2017-11-05 DIAGNOSIS — I2510 Atherosclerotic heart disease of native coronary artery without angina pectoris: Secondary | ICD-10-CM

## 2017-11-05 DIAGNOSIS — I10 Essential (primary) hypertension: Secondary | ICD-10-CM | POA: Diagnosis not present

## 2017-11-05 DIAGNOSIS — E7800 Pure hypercholesterolemia, unspecified: Secondary | ICD-10-CM

## 2017-11-05 MED ORDER — AMLODIPINE BESYLATE 5 MG PO TABS: 5.0000 mg | ORAL_TABLET | Freq: Every day | ORAL | 3 refills | Status: DC

## 2017-11-05 MED FILL — AMLODIPINE BESYLATE 5 MG TA: 5 | 90 days supply | Qty: 90 | Fill #0

## 2017-11-05 NOTE — Patient Instructions (Signed)
Medication Instructions:   STOP TELMISARTAN  START AMLODIPINE 5 MG ONCE DAILY  Labwork:  Your physician recommends that you return for lab work PRIOR TO EATING  Follow-Up:  Your physician wants you to follow-up in: [place] will receive a reminder letter in the mail two months in advance. If you don't receive a letter, please call our office to schedule the follow-up appointment.   If you need a refill on your cardiac medications before your next appointment, please call your pharmacy.

## 2017-12-03 ENCOUNTER — Telehealth: Payer: Self-pay | Admitting: Cardiology

## 2017-12-03 DIAGNOSIS — I10 Essential (primary) hypertension: Secondary | ICD-10-CM

## 2017-12-03 MED ORDER — HYDROCHLOROTHIAZIDE 25 MG PO TABS
25.0000 mg | ORAL_TABLET | Freq: Every day | ORAL | 3 refills | Status: DC
Start: 2017-12-03 — End: 2018-08-20

## 2017-12-03 MED FILL — HYDROCHLOROTHIAZIDE 25 MG T: 25 | 90 days supply | Qty: 90 | Fill #0

## 2017-12-03 NOTE — Telephone Encounter (Signed)
Pt states she was recently started on Amlodipine 5 mg on 11/05/17 and was instructed by Dr. Stanford Breed to monitor for lower extremity edema. Pt reports that she has noticed some swelling in her legs and feet which causes her shoes to leave an indentation recently. She denies any SOB or other symptoms  Routing to MD for recommendation.

## 2017-12-03 NOTE — Telephone Encounter (Signed)
Spoke with pt, Aware of dr crenshaw's recommendations. New script sent to the pharmacy and Lab orders mailed to the pt  

## 2017-12-03 NOTE — Telephone Encounter (Signed)
DC amlodipine; HCTZ 25 mg daily; bmet one week Kirk Ruths

## 2017-12-03 NOTE — Telephone Encounter (Signed)
New Message:      Pt c/o swelling: STAT is pt has developed SOB within 24 hours  1) How much weight have you gained and in what time span? N/A  2) If swelling, where is the swelling located? Feet  3) Are you currently taking a fluid pill? No   4) Are you currently SOB?   5) Do you have a log of your daily weights (if so, list)? No  6) Have you gained 3 pounds in a day or 5 pounds in a week? N/A  7) Have you traveled recently? No     Pt states that she was told to call and let us know if she starts to see swelling in her feet.

## 2017-12-17 MED FILL — LEVOTHYROXINE 50 MCG TABLET: 50 | 90 days supply | Qty: 90 | Fill #1

## 2017-12-31 ENCOUNTER — Encounter: Payer: Self-pay | Admitting: *Deleted

## 2018-01-01 MED FILL — ATORVASTATIN CALCIUM 20 MG: 20 | 90 days supply | Qty: 90 | Fill #1

## 2018-01-19 ENCOUNTER — Other Ambulatory Visit: Payer: Self-pay | Admitting: Cardiology

## 2018-01-19 DIAGNOSIS — I10 Essential (primary) hypertension: Secondary | ICD-10-CM

## 2018-01-19 MED FILL — ESCITALOPRAM 10 MG TABLET: 10 | 90 days supply | Qty: 90 | Fill #0

## 2018-01-19 MED FILL — LISINOPRIL 40 MG TABLET: 40 | 90 days supply | Qty: 90 | Fill #0

## 2018-01-22 ENCOUNTER — Encounter (HOSPITAL_COMMUNITY): Payer: Self-pay

## 2018-01-22 ENCOUNTER — Ambulatory Visit (HOSPITAL_COMMUNITY)
Admission: EM | Admit: 2018-01-22 | Discharge: 2018-01-22 | Disposition: A | Discharge status: Home / Self Care | Payer: No Typology Code available for payment source | Attending: Internal Medicine | Admitting: Internal Medicine

## 2018-01-22 DIAGNOSIS — R42 Dizziness and giddiness: Secondary | ICD-10-CM

## 2018-01-22 LAB — POCT I-STAT, CHEM 8
BUN: 16 mg/dL (ref 6–20)
CREATININE: 0.6 mg/dL (ref 0.44–1.00)
Calcium, Ion: 1.12 mmol/L — ABNORMAL LOW (ref 1.15–1.40)
Chloride: 101 mmol/L (ref 98–111)
Glucose, Bld: 131 mg/dL — ABNORMAL HIGH (ref 70–99)
HCT: 44 % (ref 36.0–46.0)
Hemoglobin: 15 g/dL (ref 12.0–15.0)
POTASSIUM: 3.5 mmol/L (ref 3.5–5.1)
Sodium: 141 mmol/L (ref 135–145)
TCO2: 25 mmol/L (ref 22–32)

## 2018-01-22 MED ORDER — ONDANSETRON 4 MG PO TBDP
ORAL_TABLET | ORAL | Status: AC
  Filled 2018-01-22: qty 1

## 2018-01-22 MED ORDER — MECLIZINE HCL 25 MG PO TABS: 25.0000 mg | ORAL_TABLET | Freq: Three times a day (TID) | ORAL | 0 refills | Status: DC | PRN

## 2018-01-22 MED ORDER — ONDANSETRON 4 MG PO TBDP: 4.0000 mg | ORAL_TABLET | Freq: Three times a day (TID) | ORAL | 0 refills | Status: DC | PRN

## 2018-01-22 MED ORDER — ONDANSETRON 4 MG PO TBDP
4.0000 mg | ORAL_TABLET | Freq: Once | ORAL | Status: AC
  Administered 2018-01-22: 4 mg via ORAL

## 2018-01-22 MED FILL — MECLIZINE 25 MG TABLET: 25 | 10 days supply | Qty: 30 | Fill #0

## 2018-01-22 MED FILL — ONDANSETRON ODT 4 MG TABLET: 4 | 4 days supply | Qty: 10 | Fill #0

## 2018-01-22 NOTE — ED Provider Notes (Signed)
[place]    CSN: [id number] Arrival date & time: 01/22/18  1143     History   Chief Complaint Chief Complaint  Patient presents with  . Dizziness    vomiting    HPI Samantha Lester is a 52 y.o. female.   52 year old female with history of CAD, GERD, HLD, HTN comes in for acute onset of dizziness and vomiting after waking up this morning. Patient states started having dizziness this morning, as if the room is spinning, worse with head movement and changes in position. She also had mild nausea with the symptoms, and at first thought it was due to taking medication with coffee and no food in stomach, though she does this every morning. She states got down to get her shoes when she felt increased dizziness and vomited. She has since then had 4-5 episodes of nonbloody vomit. Denies abdominal pain, diarrhea. Denies chest pain, shortness of breathing, palpitations. Denies exertional pain. Patient states that she had a headache last night, and cannot remember if she had a headache waking up this morning, but denies current headache. Denies weakness, syncope. Denies fever, chills, night sweats. Denies URI symptoms such as cough, congestion, sore throat. Was still able to walk child to school and back (about quarter mile) without difficulty.      Past Medical History:  Diagnosis Date  . Anxiety   . Coronary artery dissection   . GERD (gastroesophageal reflux disease)   . Heart attack ([place]) 2016  . Hyperlipidemia   . Hypertension   . Thyroid disease     Patient Active Problem List   Diagnosis Date Noted  . Near syncope 11/12/2015  . Ischemic cardiomyopathy 02/05/2015  . Hyperlipidemia 02/05/2015  . Essential hypertension 01/09/2015  . Coronary artery dissection   . NSTEMI (non-ST elevated myocardial infarction) ([place]) 01/08/2015    Past Surgical History:  Procedure Laterality Date  . CARDIAC CATHETERIZATION N/A 01/09/2015   Procedure: Left Heart Cath and Coronary  Angiography;  Surgeon: Peter M Martinique, MD;  Location: [facility];  Service: Cardiovascular;  Laterality: N/A;  . CARDIAC CATHETERIZATION N/A 01/12/2015   Procedure: Left Heart Cath and Coronary Angiography;  Surgeon: Sherren Mocha, MD;  Location: [facility];  Service: Cardiovascular;  Laterality: N/A;  . TUBAL LIGATION     had reversal 1998  . tubal ligation reversal  1998    OB History    Gravida  3   Para  2   Term  2   Preterm      AB  1   Living  2     SAB  1   TAB      Ectopic      Multiple      Live Births  2            Home Medications    Prior to Admission medications   Medication Sig Start Date End Date Taking? Authorizing Provider  aspirin EC 81 MG EC tablet Take 1 tablet (81 mg total) by mouth daily. Patient taking differently: Take 81 mg by mouth.  01/13/15   Barrett, Evelene Croon, PA-C  atorvastatin (LIPITOR) 80 MG tablet TAKE 1 TABLET (80 MG TOTAL) BY MOUTH DAILY. 01/14/17   Lelon Perla, MD  escitalopram (LEXAPRO) 10 MG tablet Take 1 tablet daily by mouth. 04/22/17   [provider]  hydrochlorothiazide (HYDRODIURIL) 25 MG tablet Take 1 tablet (25 mg total) by mouth daily. 12/03/17 03/03/18  Crenshaw,  Denice Bors, MD  levothyroxine (SYNTHROID, LEVOTHROID) 50 MCG tablet Take 1 tablet (50 mcg total) by mouth daily. 05/04/15   Lelon Perla, MD  lisinopril (PRINIVIL,ZESTRIL) 40 MG tablet Take 1 tablet (40 mg total) by mouth daily. 01/19/18   Lelon Perla, MD  meclizine (ANTIVERT) 25 MG tablet Take 1 tablet (25 mg total) by mouth 3 (three) times daily as needed for dizziness. 01/22/18   Ok Edwards, PA-C  Melatonin 1 MG CAPS Take daily by mouth.    [provider]  nitroGLYCERIN (NITROSTAT) 0.4 MG SL tablet Place 1 tablet (0.4 mg total) under the tongue every 5 (five) minutes x 3 doses as needed for chest pain. 01/13/15   Barrett, Evelene Croon, PA-C  ondansetron (ZOFRAN ODT) 4 MG disintegrating tablet Take 1 tablet (4 mg total) by  mouth every 8 (eight) hours as needed for nausea or vomiting. 01/22/18   Ok Edwards, PA-C    Family History Family History  Problem Relation Age of Onset  . Hypertension Mother   . Healthy Father   . Kidney disease Sister   . Hypertension Sister   . Healthy Sister   . Drug abuse Brother   . Alcohol abuse Brother   . Hypertension Brother   . Colon cancer Neg Hx     Social History Social History   Tobacco Use  . Smoking status: Never Smoker  . Smokeless tobacco: Never Used  Substance Use Topics  . Alcohol use: Yes    Alcohol/week: 0.0 oz    Comment: occasionally   . Drug use: No     Allergies   Molds & smuts   Review of Systems Review of Systems  Reason unable to perform ROS: See HPI as above.     Physical Exam Triage Vital Signs ED Triage Vitals [01/22/18 1210]  Enc Vitals Group     BP (!) 141/86     Pulse Rate 80     Resp 20     Temp 98.6 F (37 C)     Temp Source Temporal     SpO2 98 %     Weight      Height      Head Circumference      Peak Flow      Pain Score 0     Pain Loc      Pain Edu?      Excl. in [place]?    Orthostatic VS for the past 24 hrs:  BP- Lying Pulse- Lying BP- Sitting Pulse- Sitting BP- Standing at 0 minutes Pulse- Standing at 0 minutes  01/22/18 1332 (!) 161/108 58 (!) 178/100 61 (!) 157/97 63    Updated Vital Signs BP (!) 141/86 (BP Location: Left Arm)   Pulse 80   Temp 98.6 F (37 C) (Temporal)   Resp 20   LMP 07/07/1997 (Approximate)   SpO2 98%   Physical Exam  Constitutional: She is oriented to person, place, and time. She appears well-developed and well-nourished. No distress.  HENT:  Head: Normocephalic and atraumatic.  Right Ear: Tympanic membrane, external ear and ear canal normal. Tympanic membrane is not erythematous and not bulging.  Left Ear: Tympanic membrane, external ear and ear canal normal. Tympanic membrane is not erythematous and not bulging.  Nose: Nose normal. Right sinus exhibits no maxillary sinus  tenderness and no frontal sinus tenderness. Left sinus exhibits no maxillary sinus tenderness and no frontal sinus tenderness.  Mouth/Throat: Uvula is midline, oropharynx is clear and moist  and mucous membranes are normal.  Eyes: Pupils are equal, round, and reactive to light. Conjunctivae and EOM are normal.  EOM caused dizziness.   Neck: Normal range of motion. Neck supple.  Cardiovascular: Normal rate, regular rhythm and normal heart sounds. Exam reveals no gallop and no friction rub.  No murmur heard. Pulmonary/Chest: Effort normal and breath sounds normal. She has no decreased breath sounds. She has no wheezes. She has no rhonchi. She has no rales.  Abdominal: Soft. Bowel sounds are normal. She exhibits no mass. There is no tenderness. There is no rebound, no guarding and no CVA tenderness.  Lymphadenopathy:    She has no cervical adenopathy.  Neurological: She is alert and oriented to person, place, and time. She has normal strength. She is not disoriented. No cranial nerve deficit or sensory deficit. She displays a negative Romberg sign. Coordination and gait normal. GCS eye subscore is 4. GCS verbal subscore is 5. GCS motor subscore is 6.  Normal finger to nose, rapid movement.   Skin: Skin is warm and dry.  Psychiatric: She has a normal mood and affect. Her behavior is normal. Judgment normal.     UC Treatments / Results  Labs (all labs ordered are listed, but only abnormal results are displayed) Labs Reviewed  POCT I-STAT, CHEM 8 - Abnormal; Notable for the following components:      Result Value   Glucose, Bld 131 (*)    Calcium, Ion 1.12 (*)    All other components within normal limits    EKG None  Radiology No results found.  Procedures Procedures (including critical care time)  Medications Ordered in UC Medications  ondansetron (ZOFRAN-ODT) disintegrating tablet 4 mg (4 mg Oral Given 01/22/18 1336)    Initial Impression / Assessment and Plan / UC Course  I  have reviewed the triage vital signs and the nursing notes.  Pertinent labs & imaging results that were available during my care of the patient were reviewed by me and considered in my medical decision making (see chart for details).    No alarming signs on exam.  Neurology exam grossly intact without focal deficits.  EKG normal sinus rhythm, 60 bpm, no acute ST changes, unchanged from prior.  I-STAT within normal limits.  Negative for orthostatics. Meclizine and zofran as needed for symptomatic treatment. Push fluids. Return precautions given. Patient expresses understanding and agrees to plan.   Final Clinical Impressions(s) / UC Diagnoses   Final diagnoses:  Vertigo    ED Prescriptions    Medication Sig Dispense Auth. Provider   ondansetron (ZOFRAN ODT) 4 MG disintegrating tablet Take 1 tablet (4 mg total) by mouth every 8 (eight) hours as needed for nausea or vomiting. 10 tablet Oumar Marcott V, PA-C   meclizine (ANTIVERT) 25 MG tablet Take 1 tablet (25 mg total) by mouth 3 (three) times daily as needed for dizziness. 30 tablet Tobin Chad, [place] 01/22/18 1422

## 2018-01-22 NOTE — ED Triage Notes (Signed)
Pt presents with spells of dizziness and vomiting.

## 2018-01-22 NOTE — Discharge Instructions (Signed)
No alarming signs on exam. Your EKG was normal. Blood work without alarming signs. Will provide some zofran in case of continued nausea/vomiting. Meclizine for dizziness. Keep hydrated, your urine should be clear to pale yellow in color. Follow up with PCP if symptoms does not improving for reevaluation. If experiencing worsening symptoms, chest pain, shortness of breath, one sided weakness, lightheadedness, passing out, go to the emergency department for further evaluation needed.

## 2018-04-02 MED FILL — HYDROCHLOROTHIAZIDE 25 MG T: 25 | 90 days supply | Qty: 90 | Fill #1

## 2018-04-05 MED FILL — LEVOTHYROXINE 50 MCG TABLET: 50 | 90 days supply | Qty: 90 | Fill #2

## 2018-04-13 MED FILL — ATORVASTATIN CALCIUM 20 MG: 20 | 90 days supply | Qty: 90 | Fill #2

## 2018-04-27 ENCOUNTER — Other Ambulatory Visit: Payer: Self-pay

## 2018-04-27 ENCOUNTER — Ambulatory Visit: Payer: No Typology Code available for payment source | Admitting: Plastic Surgery

## 2018-04-27 ENCOUNTER — Encounter: Payer: Self-pay | Admitting: Plastic Surgery

## 2018-04-27 VITALS — BP 132/88 | HR 72 | Wt 166.0 lb

## 2018-04-27 DIAGNOSIS — H02834 Dermatochalasis of left upper eyelid: Secondary | ICD-10-CM | POA: Diagnosis not present

## 2018-04-27 DIAGNOSIS — H02831 Dermatochalasis of right upper eyelid: Secondary | ICD-10-CM

## 2018-04-27 DIAGNOSIS — M898X8 Other specified disorders of bone, other site: Secondary | ICD-10-CM

## 2018-04-27 NOTE — Progress Notes (Signed)
Patient ID: Samantha Lester, female    DOB: 1966-03-17, 52 y.o.   MRN: [id number]   Chief Complaint  Patient presents with  . Consult  . Skin Problem    The patient is a 52 year old white female here for further evaluation of her upper lids and sternal mass.  She was seen several months ago at my previous office.  She complained of having difficulty seeing the TV and reading particularly at the end of the day.  She noticed her legs were hanging over her eyes.  Her vision has been stable and she sees a eye doctor regularly.  She was due for an exam at the time of her last visit and has had one since.  It was also recommended that she have a visible visual field exam.  Her upper lids have skin laxity and rest at her superior pupillary line. She underwent a visual field exam which showed dramatic improvement in her visual fields with taping of the upper lids.  She also has the sternal / clavicular 3 cm mass that is soft and slight mobile.  It seems to be about the same size as the last visit.  Nothing makes it better.    Review of Systems  Constitutional: Negative.   HENT: Negative.   Eyes: Negative.   Respiratory: Negative.   Cardiovascular: Negative.   Gastrointestinal: Negative.   Endocrine: Negative.   Genitourinary: Negative.   Musculoskeletal: Negative.   Skin: Negative.  Negative for color change and wound.  Neurological: Negative.   Hematological: Negative.   Psychiatric/Behavioral: Negative.     Past Medical History:  Diagnosis Date  . Anxiety   . Coronary artery dissection   . GERD (gastroesophageal reflux disease)   . Heart attack ([place]) 2016  . Hyperlipidemia   . Hypertension   . Thyroid disease     Past Surgical History:  Procedure Laterality Date  . CARDIAC CATHETERIZATION N/A 01/09/2015   Procedure: Left Heart Cath and Coronary Angiography;  Surgeon: Peter M Martinique, MD;  Location: [facility];  Service: Cardiovascular;  Laterality: N/A;  . CARDIAC  CATHETERIZATION N/A 01/12/2015   Procedure: Left Heart Cath and Coronary Angiography;  Surgeon: Sherren Mocha, MD;  Location: [facility];  Service: Cardiovascular;  Laterality: N/A;  . TUBAL LIGATION     had reversal 1998  . tubal ligation reversal  1998      Current Outpatient Medications:  .  atorvastatin (LIPITOR) 80 MG tablet, TAKE 1 TABLET (80 MG TOTAL) BY MOUTH DAILY., Disp: 90 tablet, Rfl: 2 .  escitalopram (LEXAPRO) 10 MG tablet, Take 1 tablet daily by mouth., Disp: , Rfl: 1 .  levothyroxine (SYNTHROID, LEVOTHROID) 50 MCG tablet, Take 1 tablet (50 mcg total) by mouth daily., Disp: 30 tablet, Rfl: 0 .  lisinopril (PRINIVIL,ZESTRIL) 40 MG tablet, Take 1 tablet (40 mg total) by mouth daily., Disp: 90 tablet, Rfl: 2 .  meclizine (ANTIVERT) 25 MG tablet, Take 1 tablet (25 mg total) by mouth 3 (three) times daily as needed for dizziness., Disp: 30 tablet, Rfl: 0 .  Melatonin 1 MG CAPS, Take daily by mouth., Disp: , Rfl:  .  nitroGLYCERIN (NITROSTAT) 0.4 MG SL tablet, Place 1 tablet (0.4 mg total) under the tongue every 5 (five) minutes x 3 doses as needed for chest pain., Disp: 25 tablet, Rfl: 12 .  ondansetron (ZOFRAN ODT) 4 MG disintegrating tablet, Take 1 tablet (4 mg total) by mouth every 8 (eight) hours as needed  for nausea or vomiting., Disp: 10 tablet, Rfl: 0 .  hydrochlorothiazide (HYDRODIURIL) 25 MG tablet, Take 1 tablet (25 mg total) by mouth daily., Disp: 90 tablet, Rfl: 3   Objective:   Vitals:   04/27/18 1356  BP: 132/88  Pulse: 72  SpO2: 97%    Physical Exam  Constitutional: She is oriented to person, place, and time. She appears well-developed and well-nourished.  HENT:  Head: Normocephalic and atraumatic.  Eyes: Pupils are equal, round, and reactive to light. EOM are normal.  Cardiovascular: Normal rate.  Pulmonary/Chest: Effort normal.    Abdominal: Soft.  Neurological: She is alert and oriented to person, place, and time.  Skin: Skin is warm.    Psychiatric: She has a normal mood and affect. Her behavior is normal. Judgment and thought content normal.    Assessment & Plan:  Mass of sternum  Dermatochalasis of both upper eyelids  Plan for ultrasound of the mass at the left clavicular area.  Recommend bilateral upper lid blepharoplasty. We can also do the excision of the mass of the clavicle at the same time.  [place], DO

## 2018-05-12 MED FILL — AMLODIPINE BESYLATE 5 MG TA: 5 | 90 days supply | Qty: 90 | Fill #1

## 2018-05-12 MED FILL — LISINOPRIL 40 MG TABLET: 40 | 90 days supply | Qty: 90 | Fill #1

## 2018-05-12 MED FILL — ESCITALOPRAM 10 MG TABLET: 10 | 90 days supply | Qty: 90 | Fill #1

## 2018-05-13 ENCOUNTER — Ambulatory Visit (HOSPITAL_COMMUNITY)
Admission: RE | Admit: 2018-05-13 | Discharge: 2018-05-13 | Disposition: A | Discharge status: Home / Self Care | Payer: No Typology Code available for payment source | Source: Ambulatory Visit | Attending: Plastic Surgery | Admitting: Plastic Surgery

## 2018-05-13 DIAGNOSIS — M898X8 Other specified disorders of bone, other site: Secondary | ICD-10-CM | POA: Diagnosis not present

## 2018-05-13 DIAGNOSIS — M19012 Primary osteoarthritis, left shoulder: Secondary | ICD-10-CM | POA: Diagnosis not present

## 2018-06-15 NOTE — Progress Notes (Signed)
52 y.o. [id number] Married Other or two or more races Not Hispanic or Latino female here for annual exam.  No vaginal bleeding. No bowel or bladder c/o. Using a lubricant with intercourse, no dyspareunia.  Just started having a cough yesterday, hurts, not productive, some wheezing.  She has lost over 20 lbs since 2/19    Patient's last menstrual period was 07/07/1997 (approximate).          She stopped having her cycle around 31, one cycle at 35, never bleed after that. She was evaluated for autoimmune disorders, fine.  Sexually active: Yes.    The current method of family planning is post menopausal status.    Exercising: Yes.    walking, biking Smoker:  no  Health Maintenance: Pap:  05/07/16 Neg. HR HPV:neg  History of abnormal Pap:  no MMG:  07/28/2017 Birads 1 negative Colonoscopy:  07/23/16 polyps BMD:   05/21/16 Osteopenia -2.2. Frax risks: 2.3% and 0.2% TDaP: 2016    reports that she has never smoked. She has never used smokeless tobacco. She reports that she drinks alcohol. She reports that she does not use drugs. Rare ETOH. She is a Passenger transport manager at [employer]. She has 2 kids (42 and 2), she is raising her 71 and 7 year old grandchildren. Daughter has another baby that she is raising herself. Daughter has borderline personality disorder.  Past Medical History:  Diagnosis Date  . Anxiety   . Coronary artery dissection   . GERD (gastroesophageal reflux disease)   . Heart attack ([place]) 2016  . Hyperlipidemia   . Hypertension   . Thyroid disease     Past Surgical History:  Procedure Laterality Date  . CARDIAC CATHETERIZATION N/A 01/09/2015   Procedure: Left Heart Cath and Coronary Angiography;  Surgeon: Peter M Martinique, MD;  Location: [facility];  Service: Cardiovascular;  Laterality: N/A;  . CARDIAC CATHETERIZATION N/A 01/12/2015   Procedure: Left Heart Cath and Coronary Angiography;  Surgeon: Sherren Mocha, MD;  Location: [facility];  Service:  Cardiovascular;  Laterality: N/A;  . TUBAL LIGATION     had reversal 1998  . tubal ligation reversal  1998    Current Outpatient Medications  Medication Sig Dispense Refill  . atorvastatin (LIPITOR) 80 MG tablet TAKE 1 TABLET (80 MG TOTAL) BY MOUTH DAILY. 90 tablet 2  . escitalopram (LEXAPRO) 10 MG tablet Take 1 tablet daily by mouth.  1  . levothyroxine (SYNTHROID, LEVOTHROID) 50 MCG tablet Take 1 tablet (50 mcg total) by mouth daily. 30 tablet 0  . lisinopril (PRINIVIL,ZESTRIL) 40 MG tablet Take 1 tablet (40 mg total) by mouth daily. 90 tablet 2  . meclizine (ANTIVERT) 25 MG tablet Take 1 tablet (25 mg total) by mouth 3 (three) times daily as needed for dizziness. 30 tablet 0  . Melatonin 1 MG CAPS Take daily by mouth.    . nitroGLYCERIN (NITROSTAT) 0.4 MG SL tablet Place 1 tablet (0.4 mg total) under the tongue every 5 (five) minutes x 3 doses as needed for chest pain. 25 tablet 12  . ondansetron (ZOFRAN ODT) 4 MG disintegrating tablet Take 1 tablet (4 mg total) by mouth every 8 (eight) hours as needed for nausea or vomiting. 10 tablet 0  . hydrochlorothiazide (HYDRODIURIL) 25 MG tablet Take 1 tablet (25 mg total) by mouth daily. 90 tablet 3   No current facility-administered medications for this visit.     Family History  Problem Relation Age of Onset  .  Hypertension Mother   . Healthy Father   . Kidney disease Sister   . Hypertension Sister   . Healthy Sister   . Drug abuse Brother   . Alcohol abuse Brother   . Hypertension Brother   . Colon cancer Neg Hx     Review of Systems  Constitutional: Negative.   HENT: Negative.   Eyes: Negative.   Respiratory: Negative.   Cardiovascular: Negative.   Gastrointestinal: Negative.   Endocrine: Negative.   Genitourinary: Negative.   Musculoskeletal: Negative.   Skin: Negative.   Allergic/Immunologic: Negative.   Neurological: Negative.   Hematological: Negative.   Psychiatric/Behavioral: Negative.     Exam:   BP 122/82  (BP Location: Right Arm, Patient Position: Sitting, Cuff Size: Normal)   Pulse 76   Ht 5' 3.75" (1.619 m)   Wt 167 lb 3.2 oz (75.8 kg)   LMP 07/07/1997 (Approximate)   BMI 28.93 kg/m   Weight change: @WEIGHTCHANGE @ Height:   Height: 5' 3.75" (161.9 cm)  Ht Readings from Last 3 Encounters:  06/16/18 5' 3.75" (1.619 m)  11/05/17 5\' 1"  (1.549 m)  05/13/17 5' 0.5" (1.537 m)    General appearance: alert, cooperative and appears stated age Head: Normocephalic, without obvious abnormality, atraumatic Neck: no adenopathy, supple, symmetrical, trachea midline and thyroid normal to inspection and palpation Lungs: clear to auscultation bilaterally Cardiovascular: regular rate and rhythm Breasts: normal appearance, no masses or tenderness Abdomen: soft, non-tender; non distended,  no masses,  no organomegaly Extremities: extremities normal, atraumatic, no cyanosis or edema Skin: Skin color, texture, turgor normal. No rashes or lesions Lymph nodes: Cervical, supraclavicular, and axillary nodes normal. No abnormal inguinal nodes palpated Neurologic: Grossly normal   Pelvic: External genitalia:  no lesions              Urethra:  normal appearing urethra with no masses, tenderness or lesions              Bartholins and Skenes: normal                 Vagina: normal appearing vagina with normal color and discharge, no lesions              Cervix: no lesions               Bimanual Exam:  Uterus:  normal size, contour, position, consistency, mobility, non-tender              Adnexa: no mass, fullness, tenderness               Rectovaginal: Confirms               Anus:  normal sphincter tone, no lesions  Chaperone was present for exam.  A:  Well Woman with normal exam  Osteopenia  P:   DEXA due, ordered  Discussed breast self exam  Discussed calcium and vit D intake  No pap this year  Mammogram in 1/20  Colonoscopy UTD  Labs with primary MD

## 2018-06-16 ENCOUNTER — Ambulatory Visit (INDEPENDENT_AMBULATORY_CARE_PROVIDER_SITE_OTHER): Payer: No Typology Code available for payment source | Admitting: Obstetrics and Gynecology

## 2018-06-16 ENCOUNTER — Other Ambulatory Visit: Payer: Self-pay

## 2018-06-16 ENCOUNTER — Encounter: Payer: Self-pay | Admitting: Obstetrics and Gynecology

## 2018-06-16 VITALS — BP 122/82 | HR 76 | Ht 63.75 in | Wt 167.2 lb

## 2018-06-16 DIAGNOSIS — Z01419 Encounter for gynecological examination (general) (routine) without abnormal findings: Secondary | ICD-10-CM | POA: Diagnosis not present

## 2018-06-16 DIAGNOSIS — M8580 Other specified disorders of bone density and structure, unspecified site: Secondary | ICD-10-CM

## 2018-06-16 NOTE — Patient Instructions (Signed)
EXERCISE AND DIET:  We recommended that you start or continue a regular exercise program for good health. Regular exercise means any activity that makes your heart beat faster and makes you sweat.  We recommend exercising at least 30 minutes per day at least 3 days a week, preferably 4 or 5.  We also recommend a diet low in fat and sugar.  Inactivity, poor dietary choices and obesity can cause diabetes, heart attack, stroke, and kidney damage, among others.    ALCOHOL AND SMOKING:  Women should limit their alcohol intake to no more than 7 drinks/beers/glasses of wine (combined, not each!) per week. Moderation of alcohol intake to this level decreases your risk of breast cancer and liver damage. And of course, no recreational drugs are part of a healthy lifestyle.  And absolutely no smoking or even second hand smoke. Most people know smoking can cause heart and lung diseases, but did you know it also contributes to weakening of your bones? Aging of your skin?  Yellowing of your teeth and nails?  CALCIUM AND VITAMIN D:  Adequate intake of calcium and Vitamin D are recommended.  The recommendations for exact amounts of these supplements seem to change often, but generally speaking 1,200 mg of calcium (between diet and supplement) and 1,000 units of Vitamin D per day seems prudent. Certain women may benefit from higher intake of Vitamin D.  If you are among these women, your doctor will have told you during your visit.    PAP SMEARS:  Pap smears, to check for cervical cancer or precancers,  have traditionally been done yearly, although recent scientific advances have shown that most women can have pap smears less often.  However, every woman still should have a physical exam from her gynecologist every year. It will include a breast check, inspection of the vulva and vagina to check for abnormal growths or skin changes, a visual exam of the cervix, and then an exam to evaluate the size and shape of the uterus and  ovaries.  And after 53 years of age, a rectal exam is indicated to check for rectal cancers. We will also provide age appropriate advice regarding health maintenance, like when you should have certain vaccines, screening for sexually transmitted diseases, bone density testing, colonoscopy, mammograms, etc.   MAMMOGRAMS:  All women over 80 years old should have a yearly mammogram. Many facilities now offer a "3D" mammogram, which may cost around $50 extra out of pocket. If possible,  we recommend you accept the option to have the 3D mammogram performed.  It both reduces the number of women who will be called back for extra views which then turn out to be normal, and it is better than the routine mammogram at detecting truly abnormal areas.    COLONOSCOPY:  Colonoscopy to screen for colon cancer is recommended for all women at age 17.  We know, you hate the idea of the prep.  We agree, BUT, having colon cancer and not knowing it is worse!!  Colon cancer so often starts as a polyp that can be seen and removed at colonscopy, which can quite literally save your life!  And if your first colonoscopy is normal and you have no family history of colon cancer, most women don't have to have it again for 10 years.  Once every ten years, you can do something that may end up saving your life, right?  We will be happy to help you get it scheduled when you are ready.  Be sure to check your insurance coverage so you understand how much it will cost.  It may be covered as a preventative service at no cost, but you should check your particular policy.      Breast Self-Awareness Breast self-awareness means being familiar with how your breasts look and feel. It involves checking your breasts regularly and reporting any changes to your health care provider. Practicing breast self-awareness is important. A change in your breasts can be a sign of a serious medical problem. Being familiar with how your breasts look and feel allows  you to find any problems early, when treatment is more likely to be successful. All women should practice breast self-awareness, including women who have had breast implants. How to do a breast self-exam One way to learn what is normal for your breasts and whether your breasts are changing is to do a breast self-exam. To do a breast self-exam: Look for Changes  1. Remove all the clothing above your waist. 2. Stand in front of a mirror in a room with good lighting. 3. Put your hands on your hips. 4. Push your hands firmly downward. 5. Compare your breasts in the mirror. Look for differences between them (asymmetry), such as: ? Differences in shape. ? Differences in size. ? Puckers, dips, and bumps in one breast and not the other. 6. Look at each breast for changes in your skin, such as: ? Redness. ? Scaly areas. 7. Look for changes in your nipples, such as: ? Discharge. ? Bleeding. ? Dimpling. ? Redness. ? A change in position. Feel for Changes  Carefully feel your breasts for lumps and changes. It is best to do this while lying on your back on the floor and again while sitting or standing in the shower or tub with soapy water on your skin. Feel each breast in the following way:  Place the arm on the side of the breast you are examining above your head.  Feel your breast with the other hand.  Start in the nipple area and make  inch (2 cm) overlapping circles to feel your breast. Use the pads of your three middle fingers to do this. Apply light pressure, then medium pressure, then firm pressure. The light pressure will allow you to feel the tissue closest to the skin. The medium pressure will allow you to feel the tissue that is a little deeper. The firm pressure will allow you to feel the tissue close to the ribs.  Continue the overlapping circles, moving downward over the breast until you feel your ribs below your breast.  Move one finger-width toward the center of the body.  Continue to use the  inch (2 cm) overlapping circles to feel your breast as you move slowly up toward your collarbone.  Continue the up and down exam using all three pressures until you reach your armpit.  Write Down What You Find  Write down what is normal for each breast and any changes that you find. Keep a written record with breast changes or normal findings for each breast. By writing this information down, you do not need to depend only on memory for size, tenderness, or location. Write down where you are in your menstrual cycle, if you are still menstruating. If you are having trouble noticing differences in your breasts, do not get discouraged. With time you will become more familiar with the variations in your breasts and more comfortable with the exam. How often should I examine my breasts? Examine   your breasts every month. If you are breastfeeding, the best time to examine your breasts is after a feeding or after using a breast pump. If you menstruate, the best time to examine your breasts is 5-7 days after your period is over. During your period, your breasts are lumpier, and it may be more difficult to notice changes. When should I see my health care provider? See your health care provider if you notice:  A change in shape or size of your breasts or nipples.  A change in the skin of your breast or nipples, such as a reddened or scaly area.  Unusual discharge from your nipples.  A lump or thick area that was not there before.  Pain in your breasts.  Anything that concerns you.  This information is not intended to replace advice given to you by your health care provider. Make sure you discuss any questions you have with your health care provider. Document Released: 06/23/2005 Document Revised: 11/29/2015 Document Reviewed: 05/13/2015 Elsevier Interactive Patient Education  2018 [employer]  

## 2018-07-08 ENCOUNTER — Other Ambulatory Visit: Payer: Self-pay | Admitting: Obstetrics and Gynecology

## 2018-07-08 DIAGNOSIS — M8580 Other specified disorders of bone density and structure, unspecified site: Secondary | ICD-10-CM

## 2018-07-08 DIAGNOSIS — Z1231 Encounter for screening mammogram for malignant neoplasm of breast: Secondary | ICD-10-CM

## 2018-07-22 MED FILL — HYDROCHLOROTHIAZIDE 25 MG T: 25 | 90 days supply | Qty: 90 | Fill #2

## 2018-07-22 MED FILL — LEVOTHYROXINE 50 MCG TABLET: 50 | 90 days supply | Qty: 90 | Fill #0

## 2018-08-20 ENCOUNTER — Telehealth: Payer: Self-pay

## 2018-08-20 ENCOUNTER — Encounter: Payer: Self-pay | Admitting: Physician Assistant

## 2018-08-20 ENCOUNTER — Ambulatory Visit (INDEPENDENT_AMBULATORY_CARE_PROVIDER_SITE_OTHER): Payer: No Typology Code available for payment source | Admitting: Physician Assistant

## 2018-08-20 VITALS — BP 123/74 | HR 67 | Temp 98.4°F | Ht 61.0 in | Wt 168.5 lb

## 2018-08-20 DIAGNOSIS — H02834 Dermatochalasis of left upper eyelid: Secondary | ICD-10-CM

## 2018-08-20 DIAGNOSIS — H02831 Dermatochalasis of right upper eyelid: Secondary | ICD-10-CM

## 2018-08-20 DIAGNOSIS — M898X8 Other specified disorders of bone, other site: Secondary | ICD-10-CM

## 2018-08-20 MED ORDER — ONDANSETRON HCL 4 MG PO TABS: 4.0000 mg | ORAL_TABLET | Freq: Three times a day (TID) | ORAL | 0 refills | Status: DC | PRN

## 2018-08-20 MED ORDER — HYDROCODONE-ACETAMINOPHEN 5-325 MG PO TABS: 1.0000 | ORAL_TABLET | Freq: Four times a day (QID) | ORAL | 0 refills | Status: DC | PRN

## 2018-08-20 MED ORDER — CEPHALEXIN 500 MG PO CAPS: 500.0000 mg | ORAL_CAPSULE | Freq: Four times a day (QID) | ORAL | 0 refills | Status: DC

## 2018-08-20 MED FILL — ONDANSETRON HCL 4 MG TABLET: 4 | 6 days supply | Qty: 20 | Fill #0

## 2018-08-20 MED FILL — CEPHALEXIN 500 MG CAPSULE: 500 | 5 days supply | Qty: 20 | Fill #0

## 2018-08-20 NOTE — Telephone Encounter (Signed)
Left voice mail to call back 

## 2018-08-20 NOTE — Telephone Encounter (Signed)
Primary Cardiologist: [facility] Pre-operative Risk Assessment    Request for surgical clearance:  1. What type of surgery is being performed? Blephdroplasty  2. When is this surgery scheduled? 08/30/18   3. What type of clearance is required (medical clearance vs. Pharmacy clearance to hold med vs. Both)? Medical  4. Are there any medications that need to be held prior to surgery and how long? No    5. Practice name and name of physician performing surgery? [facility]   6. What is your office phone number [phone]    7.   What is your office fax number [phone]  8.   Anesthesia type (None, local, MAC, general) ? General   Lamar Laundry 08/20/2018, 4:40 PM  _________________________________________________________________   (provider comments below)

## 2018-08-20 NOTE — Progress Notes (Signed)
  Subjective:     Patient ID: Samantha Lester, female   DOB: [birth year], 53 y.o.   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 Pt did have an ultrasound on 05/13/18 of sternal mass.  Nothing worrisome was noted.  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  The pt today in clinic reported a hx of coronary artery dissection and subsequent MI.  The pt reports having yearly check ups. Her last visit was last May.   Review of Systems  Constitutional: Negative.   Respiratory: Negative.   Cardiovascular: Negative.   Gastrointestinal: Negative.   Skin: Negative.   Psychiatric/Behavioral: Negative.        Objective:   Physical Exam Constitutional:      Appearance: Normal appearance.  Cardiovascular:     Rate and Rhythm: Normal rate.     Heart sounds: Normal heart sounds.  Pulmonary:     Effort: Pulmonary effort is normal.     Breath sounds: Normal breath sounds.  Abdominal:     Palpations: Abdomen is soft.  Skin:    General: Skin is warm and dry.  Neurological:     Mental Status: She is alert and oriented to person, place, and time.  Psychiatric:        Mood and Affect: Mood normal.        Behavior: Behavior normal.        Thought Content: Thought content normal.        Judgment: Judgment normal.        Assessment:     Sternal mass Dermatochalasis    Plan:     [name] and the pt will work on getting cardiac clearance prior to surgery  All questions were elicited and answered  The risks that can be encountered with and after a blepharoplasty were discussed and include the following but no limited to these:  Asymmetry, dry eyes, lid lag, sensitivity to sun or bright light, difficulty closing your eyes, outward rolling of the eyelid, change in vision, fluid accumulation, firmness of the area, fat necrosis with death of fat tissue, bleeding, infection,  delayed healing, anesthesia risks, skin sensation changes, injury to structures including nerves, blood vessels, and muscles which may be temporary or permanent, hair loss, allergies to tape, suture materials and glues, blood products, topical preparations or injected agents, skin and contour irregularities, skin discoloration and swelling, deep vein thrombosis, cardiac and pulmonary complications, pain, which may persist, persistent pain, recurrence, poor healing of the incision, possible need for revisional surgery or staged procedures. Thiere can also be persistent swelling, poor wound healing, rippling or loose skin, swelling. Any change in weight fluctuations can alter the outcome.

## 2018-08-20 NOTE — H&P (View-Only) (Signed)
  Subjective:     Patient ID: Samantha Lester, female   DOB: March 05, 1966, 53 y.o.   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 Pt did have an ultrasound on 05/13/18 of sternal mass.  Nothing worrisome was noted.  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  The pt today in clinic reported a hx of coronary artery dissection and subsequent MI.  The pt reports having yearly check ups. Her last visit was last May.   Review of Systems  Constitutional: Negative.   Respiratory: Negative.   Cardiovascular: Negative.   Gastrointestinal: Negative.   Skin: Negative.   Psychiatric/Behavioral: Negative.        Objective:   Physical Exam Constitutional:      Appearance: Normal appearance.  Cardiovascular:     Rate and Rhythm: Normal rate.     Heart sounds: Normal heart sounds.  Pulmonary:     Effort: Pulmonary effort is normal.     Breath sounds: Normal breath sounds.  Abdominal:     Palpations: Abdomen is soft.  Skin:    General: Skin is warm and dry.  Neurological:     Mental Status: She is alert and oriented to person, place, and time.  Psychiatric:        Mood and Affect: Mood normal.        Behavior: Behavior normal.        Thought Content: Thought content normal.        Judgment: Judgment normal.        Assessment:     Sternal mass Dermatochalasis    Plan:     Samantha Lester and the pt will work on getting cardiac clearance prior to surgery  All questions were elicited and answered  The risks that can be encountered with and after a blepharoplasty were discussed and include the following but no limited to these:  Asymmetry, dry eyes, lid lag, sensitivity to sun or bright light, difficulty closing your eyes, outward rolling of the eyelid, change in vision, fluid accumulation, firmness of the area, fat necrosis with death of fat tissue, bleeding, infection,  delayed healing, anesthesia risks, skin sensation changes, injury to structures including nerves, blood vessels, and muscles which may be temporary or permanent, hair loss, allergies to tape, suture materials and glues, blood products, topical preparations or injected agents, skin and contour irregularities, skin discoloration and swelling, deep vein thrombosis, cardiac and pulmonary complications, pain, which may persist, persistent pain, recurrence, poor healing of the incision, possible need for revisional surgery or staged procedures. Thiere can also be persistent swelling, poor wound healing, rippling or loose skin, swelling. Any change in weight fluctuations can alter the outcome.

## 2018-08-23 ENCOUNTER — Encounter: Payer: Self-pay | Admitting: Cardiology

## 2018-08-23 NOTE — Telephone Encounter (Signed)
   Primary Cardiologist:Brian Stanford Breed, MD  Chart reviewed as part of pre-operative protocol coverage. Because of Nico Kingston's past medical history and time since last visit, he/she will require a follow-up visit in order to better assess preoperative cardiovascular risk.  Pre-op covering staff: - Please schedule appointment and call patient to inform them. - Please contact requesting surgeon's office via preferred method (i.e, phone, fax) to inform them of need for appointment prior to surgery.  [place], [place]  08/23/2018, 9:14 AM

## 2018-08-23 NOTE — Telephone Encounter (Signed)
Spoke with pt and schedule an appt with Ellen Henri, PA on 2/18 @ 11 am. Pt is agreeable to schedule appt time and date and thanked me for the call. Will fax over pre-clearance notes to requesting surgeon's office.

## 2018-08-24 ENCOUNTER — Encounter: Payer: Self-pay | Admitting: Cardiology

## 2018-08-24 ENCOUNTER — Ambulatory Visit: Payer: No Typology Code available for payment source | Admitting: Cardiology

## 2018-08-24 ENCOUNTER — Other Ambulatory Visit: Payer: Self-pay

## 2018-08-24 ENCOUNTER — Encounter (HOSPITAL_BASED_OUTPATIENT_CLINIC_OR_DEPARTMENT_OTHER): Payer: Self-pay | Admitting: *Deleted

## 2018-08-24 VITALS — BP 120/82 | HR 66 | Ht 61.0 in | Wt 166.8 lb

## 2018-08-24 DIAGNOSIS — E785 Hyperlipidemia, unspecified: Secondary | ICD-10-CM | POA: Diagnosis not present

## 2018-08-24 DIAGNOSIS — I2510 Atherosclerotic heart disease of native coronary artery without angina pectoris: Secondary | ICD-10-CM | POA: Diagnosis not present

## 2018-08-24 NOTE — Patient Instructions (Signed)
Medication Instructions:  START ASPIRIN 81 mg DAILY AFTER SURGERY CLEARANCE If you need a refill on your cardiac medications before your next appointment, please call your pharmacy.   Lab work:TODAY FLP HFT If you have labs (blood work) drawn today and your tests are completely normal, you will receive your results only by: Marland Kitchen MyChart Message (if you have MyChart) OR . A paper copy in the mail If you have any lab test that is abnormal or we need to change your treatment, we will call you to review the results.  Testing/Procedures: NONE  Follow-Up:Dr Crenshaw At [facility], you and your health needs are our priority.  As part of our continuing mission to provide you with exceptional heart care, we have created designated Provider Care Teams.  These Care Teams include your primary Cardiologist (physician) and Advanced Practice Providers (APPs -  Physician Assistants and Nurse Practitioners) who all work together to provide you with the care you need, when you need it. .   Any Other Special Instructions Will Be Listed Below (If Applicable).

## 2018-08-24 NOTE — Progress Notes (Signed)
08/24/2018 Samantha Lester   [birth year]  [id number]  Primary Physician Tisovec, Fransico Him, MD Primary Cardiologist: Kirk Ruths, MD  Electrophysiologist: None   Reason for Visit/CC: Preoperative cardiovascular examination   HPI:  Samantha Lester is a 53 y.o. female, followed by Dr. Stanford Breed, who presents to clinic today for preoperative cardiac evaluation prior to blepharoplasty surgery and resection of a soft tissue mass overlying the upper sternum.    Her cardiac history is notable for CAD. She was admitted 7/16 with NSTEMI. Echo 7/16 showed normal LV function and mild MR. 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 EF 40-45. CVTS saw patient but felt CABG not indicated. Patient treated medically. FU cath 01/12/15 showed improvement of dissection with residual 75 OM1 and 80 distal Lcx.  She has done well since.  She was last seen by Dr. Stanford Breed Nov 05, 2017 and denied any cardiac symptoms including no chest pain or dyspnea.  She has since been maintained on medical therapy including statin. She is no longer on ASA. She reports that she just forgets to take ASA daily and has not taken it for a while now.  She is on ACE inhibitor therapy with lisinopril as well as a calcium channel blocker, amlodipine for blood pressure.  It is noted that she did not tolerate beta-blockers in the past.  It appears she has not had repeat echocardiogram since July 2016. She had a lipid panel in 06/2018 that was done fasting, but LDL was elevated at 123. She reports full compliance w/ Lipitor.   From a cardiac standpoint, she denies any cardiac symptoms.  No exertional chest pain or dyspnea.  She is very active.  She runs, bikes and walks for exercise without any exertional symptoms.  She also works as a Passenger transport manager at [employer].  She reports that she parks in the parking deck and walks up several flight of stairs to get into work daily without any  exertional symptoms.  EKG today shows normal sinus rhythm.  No ischemic abnormalities.  Blood pressures well controlled.  Cardiac Studies   Current Meds  Medication Sig  . atorvastatin (LIPITOR) 80 MG tablet TAKE 1 TABLET (80 MG TOTAL) BY MOUTH DAILY.  . cephALEXin (KEFLEX) 500 MG capsule Take 1 capsule (500 mg total) by mouth 4 (four) times daily.  Marland Kitchen escitalopram (LEXAPRO) 10 MG tablet Take 1 tablet daily by mouth.  Marland Kitchen HYDROcodone-acetaminophen (NORCO/VICODIN) 5-325 MG tablet Take 1 tablet by mouth every 6 (six) hours as needed for moderate pain (post op).  Marland Kitchen levothyroxine (SYNTHROID, LEVOTHROID) 50 MCG tablet Take 1 tablet (50 mcg total) by mouth daily.  Marland Kitchen lisinopril (PRINIVIL,ZESTRIL) 40 MG tablet Take 1 tablet (40 mg total) by mouth daily.  . meclizine (ANTIVERT) 25 MG tablet Take 1 tablet (25 mg total) by mouth 3 (three) times daily as needed for dizziness.  . Melatonin 1 MG CAPS Take daily by mouth.  . nitroGLYCERIN (NITROSTAT) 0.4 MG SL tablet Place 1 tablet (0.4 mg total) under the tongue every 5 (five) minutes x 3 doses as needed for chest pain.  Marland Kitchen ondansetron (ZOFRAN) 4 MG tablet Take 1 tablet (4 mg total) by mouth every 8 (eight) hours as needed for nausea or vomiting (post op).   Allergies  Allergen Reactions  . Molds & Smuts    Past Medical History:  Diagnosis Date  . Anxiety   . Coronary artery dissection   . GERD (  gastroesophageal reflux disease)   . Heart attack ([place]) 2016  . Hyperlipidemia   . Hypertension   . Thyroid disease    Family History  Problem Relation Age of Onset  . Hypertension Mother   . Healthy Father   . Kidney disease Sister   . Hypertension Sister   . Healthy Sister   . Drug abuse Brother   . Alcohol abuse Brother   . Hypertension Brother   . Colon cancer Neg Hx    Past Surgical History:  Procedure Laterality Date  . CARDIAC CATHETERIZATION N/A 01/09/2015   Procedure: Left Heart Cath and Coronary Angiography;  Surgeon: Peter M Martinique, MD;   Location: [facility];  Service: Cardiovascular;  Laterality: N/A;  . CARDIAC CATHETERIZATION N/A 01/12/2015   Procedure: Left Heart Cath and Coronary Angiography;  Surgeon: Sherren Mocha, MD;  Location: [facility];  Service: Cardiovascular;  Laterality: N/A;  . TUBAL LIGATION     had reversal 1998  . tubal ligation reversal  1998   Social History   Socioeconomic History  . Marital status: Married    Spouse name: Not on file  . Number of children: Not on file  . Years of education: Not on file  . Highest education level: Not on file  Occupational History  . Not on file  Social Needs  . Financial resource strain: Not on file  . Food insecurity:    Worry: Not on file    Inability: Not on file  . Transportation needs:    Medical: Not on file    Non-medical: Not on file  Tobacco Use  . Smoking status: Never Smoker  . Smokeless tobacco: Never Used  Substance and Sexual Activity  . Alcohol use: Yes    Alcohol/week: 0.0 standard drinks    Comment: occasionally   . Drug use: No  . Sexual activity: Yes    Partners: Male    Birth control/protection: Post-menopausal  Lifestyle  . Physical activity:    Days per week: Not on file    Minutes per session: Not on file  . Stress: Not on file  Relationships  . Social connections:    Talks on phone: Not on file    Gets together: Not on file    Attends religious service: Not on file    Active member of club or organization: Not on file    Attends meetings of clubs or organizations: Not on file    Relationship status: Not on file  . Intimate partner violence:    Fear of current or ex partner: Not on file    Emotionally abused: Not on file    Physically abused: Not on file    Forced sexual activity: Not on file  Other Topics Concern  . Not on file  Social History Narrative  . Not on file     Lipid Panel     Component Value Date/Time   CHOL 151 02/22/2015 1040   TRIG 92 02/22/2015 1040   HDL 43 (L) 02/22/2015  1040   CHOLHDL 3.5 02/22/2015 1040   VLDL 18 02/22/2015 1040   [place] 90 02/22/2015 1040    Review of Systems: General: negative for chills, fever, night sweats or weight changes.  Cardiovascular: negative for chest pain, dyspnea on exertion, edema, orthopnea, palpitations, paroxysmal nocturnal dyspnea or shortness of breath Dermatological: negative for rash Respiratory: negative for cough or wheezing Urologic: negative for hematuria Abdominal: negative for nausea, vomiting, diarrhea, bright red blood per rectum,  melena, or hematemesis Neurologic: negative for visual changes, syncope, or dizziness All other systems reviewed and are otherwise negative except as noted above.   Physical Exam:  Blood pressure 120/82, pulse 66, height 5\' 1"  (1.549 m), weight 166 lb 12.8 oz (75.7 kg), last menstrual period 07/07/1997, SpO2 98 %.  General appearance: alert, cooperative and no distress Neck: no carotid bruit and no JVD Lungs: clear to auscultation bilaterally Heart: regular rate and rhythm, S1, S2 normal, no murmur, click, rub or gallop Extremities: extremities normal, atraumatic, no cyanosis or edema Pulses: 2+ and symmetric Skin: Skin color, texture, turgor normal. No rashes or lesions Neurologic: Grossly normal  EKG NSR 66 bpm no ischemic abnormalities -- personally reviewed   ASSESSMENT AND PLAN:   1. H/o SCAD: Occurred in 2017. Involvement of the distal LM, LAD, LCx and severe involvement of OM1.  Treated medically.  She has done well since that time. No CP. Continue medical therapy.   2. HTN: Controlled on current regimen.  3. HLD: Patient reports full compliance with Lipitor however she had fasting lipid panel done in December 2019 which showed elevated LDL at 123 mg/dL.  She is fasting today.  We will repeat lipid panel to reassess levels.  Further recommendations regarding statin dosing to be determined based on results.  4. Preoperative Evaluation: From a cardiac standpoint,  she denies any cardiac symptoms.  No exertional chest pain or dyspnea.  She is very active and able to complete > 4 METS w/o limitations or symptoms.  She runs, bikes and walks for exercise without any exertional symptoms.  She also works as a Passenger transport manager at [employer].  She reports that she parks in the parking deck and walks up several flight of stairs to get into work daily without any exertional symptoms.  EKG today shows normal sinus rhythm.  No ischemic abnormalities.  Physical exam is benign.  Based on guidelines, the patient can be cleared for surgery without need for further cardiac testing.  She is of acceptable risk.  Patient reports that she is not currently taking aspirin as outlined above in HPI.  Given her cardiac history, I recommend that once she has finished surgery and has been cleared from a surgical standpoint to restart low-dose daily baby aspirin.    Follow-Up: keep scheduled f/u with Dr. Stanford Breed in the spring.    Ladoris Gene, MHS [facility] 08/24/2018 11:46 AM

## 2018-08-25 ENCOUNTER — Telehealth: Payer: Self-pay

## 2018-08-25 DIAGNOSIS — Z79899 Other long term (current) drug therapy: Secondary | ICD-10-CM

## 2018-08-25 DIAGNOSIS — E785 Hyperlipidemia, unspecified: Secondary | ICD-10-CM

## 2018-08-25 LAB — HEPATIC FUNCTION PANEL
ALK PHOS: 63 IU/L (ref 39–117)
ALT: 18 IU/L (ref 0–32)
AST: 18 IU/L (ref 0–40)
Albumin: 4.7 g/dL (ref 3.8–4.9)
BILIRUBIN TOTAL: 0.4 mg/dL (ref 0.0–1.2)
BILIRUBIN, DIRECT: 0.09 mg/dL (ref 0.00–0.40)
TOTAL PROTEIN: 7.3 g/dL (ref 6.0–8.5)

## 2018-08-25 LAB — LIPID PANEL
Chol/HDL Ratio: 4 ratio (ref 0.0–4.4)
Cholesterol, Total: 202 mg/dL — ABNORMAL HIGH (ref 100–199)
HDL: 50 mg/dL (ref >39)
LDL Calculated: 130 mg/dL — ABNORMAL HIGH (ref 0–99)
TRIGLYCERIDES: 109 mg/dL (ref 0–149)
VLDL Cholesterol Cal: 22 mg/dL (ref 5–40)

## 2018-08-25 MED ORDER — ATORVASTATIN CALCIUM 80 MG PO TABS: 80.0000 mg | ORAL_TABLET | Freq: Every day | ORAL | 2 refills | Status: AC

## 2018-08-25 NOTE — Telephone Encounter (Signed)
Ok her med list said 80 mg. If only taking 20, then lets just increase to 40 mg for now. Thanks.

## 2018-08-25 NOTE — Telephone Encounter (Signed)
Notes recorded by Frederik Schmidt, RN on 08/25/2018 at 3:21 PM EST I spoke to the patient who informed me that she has been taking Atorvastatin 20 mg Daily, NOT 80 mg as prescribed. I told her to start taking 80 mg daily and we will recheck labs on 3/30. She verbalized understanding. ------

## 2018-08-25 NOTE — Telephone Encounter (Signed)
-----   Message from Consuelo Pandy, [place] sent at 08/25/2018  1:14 PM EST ----- Cholesterol is high. LDL bad cholesterol is 130. Normal is less than 100. Given her cardiac history, would like to get even lower to < 70. Check to see if she has been fully compliant with Liptior 80 mg every night. If not, she needs to improve compliance. If unable to tolerate nightly, let us know. If she has been compliant, then we will need to add another agent, Zetia 5 mg daily along with Lipitor 80. Recheck FLP and HFTs in 6 weeks.

## 2018-08-26 MED FILL — ATORVASTATIN 80 MG TABLET: 80 | 90 days supply | Qty: 90 | Fill #0

## 2018-08-30 ENCOUNTER — Encounter (HOSPITAL_BASED_OUTPATIENT_CLINIC_OR_DEPARTMENT_OTHER): Payer: Self-pay

## 2018-08-30 ENCOUNTER — Encounter (HOSPITAL_BASED_OUTPATIENT_CLINIC_OR_DEPARTMENT_OTHER)
Admission: RE | Disposition: A | Discharge status: Home / Self Care | Payer: Self-pay | Source: Home / Self Care | Attending: Plastic Surgery

## 2018-08-30 ENCOUNTER — Ambulatory Visit (HOSPITAL_BASED_OUTPATIENT_CLINIC_OR_DEPARTMENT_OTHER): Payer: No Typology Code available for payment source | Admitting: Anesthesiology

## 2018-08-30 ENCOUNTER — Ambulatory Visit (HOSPITAL_BASED_OUTPATIENT_CLINIC_OR_DEPARTMENT_OTHER)
Admission: RE | Admit: 2018-08-30 | Discharge: 2018-08-30 | Disposition: A | Discharge status: Home / Self Care | Payer: No Typology Code available for payment source | Attending: Plastic Surgery | Admitting: Plastic Surgery

## 2018-08-30 ENCOUNTER — Other Ambulatory Visit: Payer: Self-pay

## 2018-08-30 DIAGNOSIS — E039 Hypothyroidism, unspecified: Secondary | ICD-10-CM | POA: Diagnosis not present

## 2018-08-30 DIAGNOSIS — I10 Essential (primary) hypertension: Secondary | ICD-10-CM | POA: Insufficient documentation

## 2018-08-30 DIAGNOSIS — K219 Gastro-esophageal reflux disease without esophagitis: Secondary | ICD-10-CM | POA: Diagnosis not present

## 2018-08-30 DIAGNOSIS — H0234 Blepharochalasis left upper eyelid: Secondary | ICD-10-CM | POA: Insufficient documentation

## 2018-08-30 DIAGNOSIS — E65 Localized adiposity: Secondary | ICD-10-CM | POA: Diagnosis not present

## 2018-08-30 DIAGNOSIS — R221 Localized swelling, mass and lump, neck: Secondary | ICD-10-CM | POA: Diagnosis not present

## 2018-08-30 DIAGNOSIS — H0231 Blepharochalasis right upper eyelid: Secondary | ICD-10-CM | POA: Diagnosis not present

## 2018-08-30 DIAGNOSIS — H02834 Dermatochalasis of left upper eyelid: Secondary | ICD-10-CM | POA: Diagnosis not present

## 2018-08-30 DIAGNOSIS — H02831 Dermatochalasis of right upper eyelid: Secondary | ICD-10-CM | POA: Diagnosis not present

## 2018-08-30 HISTORY — DX: Adverse effect of unspecified anesthetic, initial encounter: T41.45XA

## 2018-08-30 HISTORY — DX: Other specified postprocedural states: Z98.890

## 2018-08-30 HISTORY — DX: Hypothyroidism, unspecified: E03.9

## 2018-08-30 HISTORY — DX: Other complications of anesthesia, initial encounter: T88.59XA

## 2018-08-30 HISTORY — PX: BROW LIFT: SHX178

## 2018-08-30 HISTORY — DX: Nausea with vomiting, unspecified: R11.2

## 2018-08-30 HISTORY — PX: MASS EXCISION: SHX2000

## 2018-08-30 SURGERY — EXCISION MASS
Anesthesia: General | Site: Eye

## 2018-08-30 MED ORDER — SODIUM CHLORIDE 0.9 % IV SOLN: 250.0000 mL | INTRAVENOUS | Status: DC | PRN

## 2018-08-30 MED ORDER — PHENYLEPHRINE 40 MCG/ML (10ML) SYRINGE FOR IV PUSH (FOR BLOOD PRESSURE SUPPORT)
PREFILLED_SYRINGE | INTRAVENOUS | Status: DC | PRN
  Administered 2018-08-30 (×2): 80 ug via INTRAVENOUS

## 2018-08-30 MED ORDER — DEXAMETHASONE SODIUM PHOSPHATE 10 MG/ML IJ SOLN
INTRAMUSCULAR | Status: AC
  Filled 2018-08-30: qty 1

## 2018-08-30 MED ORDER — SODIUM CHLORIDE 0.9% FLUSH: 3.0000 mL | Freq: Two times a day (BID) | INTRAVENOUS | Status: DC

## 2018-08-30 MED ORDER — PROPOFOL 10 MG/ML IV BOLUS
INTRAVENOUS | Status: DC | PRN
  Administered 2018-08-30: 150 mg via INTRAVENOUS

## 2018-08-30 MED ORDER — ARTIFICIAL TEARS OPHTHALMIC OINT
TOPICAL_OINTMENT | OPHTHALMIC | Status: AC
  Filled 2018-08-30: qty 3.5

## 2018-08-30 MED ORDER — BUPIVACAINE-EPINEPHRINE 0.25% -1:200000 IJ SOLN
INTRAMUSCULAR | Status: DC | PRN
  Administered 2018-08-30: 4 mL

## 2018-08-30 MED ORDER — DEXAMETHASONE SODIUM PHOSPHATE 4 MG/ML IJ SOLN
INTRAMUSCULAR | Status: DC | PRN
  Administered 2018-08-30: 10 mg via INTRAVENOUS

## 2018-08-30 MED ORDER — EPHEDRINE SULFATE 50 MG/ML IJ SOLN
INTRAMUSCULAR | Status: DC | PRN
  Administered 2018-08-30: 10 mg via INTRAVENOUS

## 2018-08-30 MED ORDER — MIDAZOLAM HCL 2 MG/2ML IJ SOLN
INTRAMUSCULAR | Status: AC
  Filled 2018-08-30: qty 2

## 2018-08-30 MED ORDER — LACTATED RINGERS IV SOLN
INTRAVENOUS | Status: DC
  Administered 2018-08-30: 08:00:00 via INTRAVENOUS

## 2018-08-30 MED ORDER — FENTANYL CITRATE (PF) 100 MCG/2ML IJ SOLN
INTRAMUSCULAR | Status: AC
  Filled 2018-08-30: qty 2

## 2018-08-30 MED ORDER — ONDANSETRON HCL 4 MG/2ML IJ SOLN
INTRAMUSCULAR | Status: AC
  Filled 2018-08-30: qty 2

## 2018-08-30 MED ORDER — TOBRAMYCIN-DEXAMETHASONE 0.3-0.1 % OP OINT
TOPICAL_OINTMENT | OPHTHALMIC | Status: AC
  Filled 2018-08-30: qty 3.5

## 2018-08-30 MED ORDER — GLYCOPYRROLATE 0.2 MG/ML IJ SOLN
INTRAMUSCULAR | Status: DC | PRN
  Administered 2018-08-30: 0.2 mg via INTRAVENOUS

## 2018-08-30 MED ORDER — BACITRACIN ZINC 500 UNIT/GM EX OINT
TOPICAL_OINTMENT | CUTANEOUS | Status: AC
  Filled 2018-08-30: qty 0.9

## 2018-08-30 MED ORDER — BSS IO SOLN
INTRAOCULAR | Status: AC
  Filled 2018-08-30: qty 15

## 2018-08-30 MED ORDER — LIDOCAINE-EPINEPHRINE 1 %-1:100000 IJ SOLN
INTRAMUSCULAR | Status: DC | PRN
  Administered 2018-08-30: 3 mL

## 2018-08-30 MED ORDER — CEFAZOLIN SODIUM-DEXTROSE 2-4 GM/100ML-% IV SOLN
2.0000 g | INTRAVENOUS | Status: AC
  Administered 2018-08-30: 2 g via INTRAVENOUS

## 2018-08-30 MED ORDER — BUPIVACAINE-EPINEPHRINE 0.25% -1:200000 IJ SOLN
INTRAMUSCULAR | Status: AC
  Filled 2018-08-30: qty 1

## 2018-08-30 MED ORDER — PROMETHAZINE HCL 25 MG/ML IJ SOLN: 6.2500 mg | INTRAMUSCULAR | Status: DC | PRN

## 2018-08-30 MED ORDER — OXYCODONE HCL 5 MG PO TABS: 5.0000 mg | ORAL_TABLET | ORAL | Status: DC | PRN

## 2018-08-30 MED ORDER — ACETAMINOPHEN 10 MG/ML IV SOLN: 1000.0000 mg | Freq: Once | INTRAVENOUS | Status: DC | PRN

## 2018-08-30 MED ORDER — ACETAMINOPHEN 325 MG PO TABS: 650.0000 mg | ORAL_TABLET | ORAL | Status: DC | PRN

## 2018-08-30 MED ORDER — LIDOCAINE HCL (CARDIAC) PF 100 MG/5ML IV SOSY
PREFILLED_SYRINGE | INTRAVENOUS | Status: DC | PRN
  Administered 2018-08-30: 60 mg via INTRAVENOUS

## 2018-08-30 MED ORDER — ONDANSETRON HCL 4 MG/2ML IJ SOLN
INTRAMUSCULAR | Status: DC | PRN
  Administered 2018-08-30 (×2): 4 mg via INTRAVENOUS

## 2018-08-30 MED ORDER — GLYCOPYRROLATE PF 0.2 MG/ML IJ SOSY
PREFILLED_SYRINGE | INTRAMUSCULAR | Status: AC
  Filled 2018-08-30: qty 1

## 2018-08-30 MED ORDER — CEFAZOLIN SODIUM-DEXTROSE 2-4 GM/100ML-% IV SOLN
INTRAVENOUS | Status: AC
  Filled 2018-08-30: qty 100

## 2018-08-30 MED ORDER — FENTANYL CITRATE (PF) 100 MCG/2ML IJ SOLN
50.0000 ug | INTRAMUSCULAR | Status: DC | PRN
  Administered 2018-08-30 (×2): 50 ug via INTRAVENOUS

## 2018-08-30 MED ORDER — BACITRACIN-POLYMYXIN B 500-10000 UNIT/GM OP OINT
TOPICAL_OINTMENT | OPHTHALMIC | Status: AC
  Filled 2018-08-30: qty 3.5

## 2018-08-30 MED ORDER — ACETAMINOPHEN 650 MG RE SUPP: 650.0000 mg | RECTAL | Status: DC | PRN

## 2018-08-30 MED ORDER — SCOPOLAMINE 1 MG/3DAYS TD PT72: 1.0000 | MEDICATED_PATCH | Freq: Once | TRANSDERMAL | Status: DC | PRN

## 2018-08-30 MED ORDER — MIDAZOLAM HCL 2 MG/2ML IJ SOLN
1.0000 mg | INTRAMUSCULAR | Status: DC | PRN
  Administered 2018-08-30: 2 mg via INTRAVENOUS

## 2018-08-30 MED ORDER — LIDOCAINE-EPINEPHRINE 1 %-1:100000 IJ SOLN
INTRAMUSCULAR | Status: AC
  Filled 2018-08-30: qty 1

## 2018-08-30 MED ORDER — FENTANYL CITRATE (PF) 100 MCG/2ML IJ SOLN
25.0000 ug | INTRAMUSCULAR | Status: DC | PRN
  Administered 2018-08-30: 50 ug via INTRAVENOUS

## 2018-08-30 MED ORDER — LIDOCAINE 2% (20 MG/ML) 5 ML SYRINGE
INTRAMUSCULAR | Status: AC
  Filled 2018-08-30: qty 5

## 2018-08-30 MED ORDER — PHENYLEPHRINE 40 MCG/ML (10ML) SYRINGE FOR IV PUSH (FOR BLOOD PRESSURE SUPPORT)
PREFILLED_SYRINGE | INTRAVENOUS | Status: AC
  Filled 2018-08-30: qty 10

## 2018-08-30 MED ORDER — SODIUM CHLORIDE 0.9% FLUSH: 3.0000 mL | INTRAVENOUS | Status: DC | PRN

## 2018-08-30 SURGICAL SUPPLY — 70 items
APPLICATOR COTTON TIP 6 STRL (MISCELLANEOUS) IMPLANT
APPLICATOR COTTON TIP 6IN STRL (MISCELLANEOUS)
APPLICATOR DR MATTHEWS STRL (MISCELLANEOUS) IMPLANT
BENZOIN TINCTURE PRP APPL 2/3 (GAUZE/BANDAGES/DRESSINGS) ×1 IMPLANT
BLADE CLIPPER SURG (BLADE) IMPLANT
BLADE SURG 15 STRL LF DISP TIS (BLADE) ×4 IMPLANT
BLADE SURG 15 STRL SS (BLADE) ×2
BNDG CONFORM 2 STRL LF (GAUZE/BANDAGES/DRESSINGS) IMPLANT
BNDG ELASTIC 2X5.8 VLCR STR LF (GAUZE/BANDAGES/DRESSINGS) IMPLANT
BNDG EYE OVAL (GAUZE/BANDAGES/DRESSINGS) ×6 IMPLANT
CANISTER SUCT 1200ML W/VALVE (MISCELLANEOUS) IMPLANT
CHLORAPREP W/TINT 26ML (MISCELLANEOUS) IMPLANT
CLEANER CAUTERY TIP 5X5 PAD (MISCELLANEOUS) IMPLANT
CORD BIPOLAR FORCEPS 12FT (ELECTRODE) ×3 IMPLANT
COVER BACK TABLE 60X90IN (DRAPES) ×3 IMPLANT
COVER MAYO STAND STRL (DRAPES) ×3 IMPLANT
COVER WAND RF STERILE (DRAPES) IMPLANT
DECANTER SPIKE VIAL GLASS SM (MISCELLANEOUS) IMPLANT
DERMABOND ADVANCED (GAUZE/BANDAGES/DRESSINGS)
DERMABOND ADVANCED .7 DNX12 (GAUZE/BANDAGES/DRESSINGS) IMPLANT
DRAPE LAPAROTOMY 100X72 PEDS (DRAPES) ×1 IMPLANT
DRAPE U-SHAPE 76X120 STRL (DRAPES) ×3 IMPLANT
DRSG TEGADERM 2-3/8X2-3/4 SM (GAUZE/BANDAGES/DRESSINGS) IMPLANT
DRSG TEGADERM 4X4.75 (GAUZE/BANDAGES/DRESSINGS) IMPLANT
ELECT COATED BLADE 2.86 ST (ELECTRODE) ×1 IMPLANT
ELECT NDL BLADE 2-5/6 (NEEDLE) IMPLANT
ELECT NEEDLE BLADE 2-5/6 (NEEDLE) ×3 IMPLANT
ELECT REM PT RETURN 9FT ADLT (ELECTROSURGICAL) ×3
ELECT REM PT RETURN 9FT PED (ELECTROSURGICAL)
ELECTRODE REM PT RETRN 9FT PED (ELECTROSURGICAL) IMPLANT
ELECTRODE REM PT RTRN 9FT ADLT (ELECTROSURGICAL) ×2 IMPLANT
GAUZE SPONGE 4X4 12PLY STRL LF (GAUZE/BANDAGES/DRESSINGS) IMPLANT
GLOVE BIO SURGEON STRL SZ 6.5 (GLOVE) ×6 IMPLANT
GOWN STRL REUS W/ TWL LRG LVL3 (GOWN DISPOSABLE) ×4 IMPLANT
GOWN STRL REUS W/TWL LRG LVL3 (GOWN DISPOSABLE) ×2
NDL HYPO 30GX1 BEV (NEEDLE) IMPLANT
NDL PRECISIONGLIDE 27X1.5 (NEEDLE) ×2 IMPLANT
NEEDLE HYPO 30GX1 BEV (NEEDLE) ×3 IMPLANT
NEEDLE PRECISIONGLIDE 27X1.5 (NEEDLE) ×3 IMPLANT
NS IRRIG 1000ML POUR BTL (IV SOLUTION) IMPLANT
PACK BASIN DAY SURGERY FS (CUSTOM PROCEDURE TRAY) ×3 IMPLANT
PAD CLEANER CAUTERY TIP 5X5 (MISCELLANEOUS) ×1
PENCIL BUTTON HOLSTER BLD 10FT (ELECTRODE) ×1 IMPLANT
RUBBERBAND STERILE (MISCELLANEOUS) IMPLANT
SHEET MEDIUM DRAPE 40X70 STRL (DRAPES) ×2 IMPLANT
SHIELD EYE LENSE ONLY DISP (GAUZE/BANDAGES/DRESSINGS) ×3 IMPLANT
SLEEVE SCD COMPRESS KNEE MED (MISCELLANEOUS) IMPLANT
SPONGE GAUZE 2X2 8PLY STRL LF (GAUZE/BANDAGES/DRESSINGS) IMPLANT
STRIP CLOSURE SKIN 1/2X4 (GAUZE/BANDAGES/DRESSINGS) ×3 IMPLANT
STRIP SUTURE WOUND CLOSURE 1/2 (SUTURE) IMPLANT
SUCTION FRAZIER HANDLE 10FR (MISCELLANEOUS)
SUCTION TUBE FRAZIER 10FR DISP (MISCELLANEOUS) IMPLANT
SUT MNCRL 6-0 UNDY P1 1X18 (SUTURE) IMPLANT
SUT MNCRL AB 3-0 PS2 18 (SUTURE) IMPLANT
SUT MNCRL AB 4-0 PS2 18 (SUTURE) ×1 IMPLANT
SUT MON AB 5-0 P3 18 (SUTURE) IMPLANT
SUT MON AB 5-0 PS2 18 (SUTURE) ×1 IMPLANT
SUT MONOCRYL 6-0 P1 1X18 (SUTURE)
SUT PROLENE 5 0 P 3 (SUTURE) IMPLANT
SUT PROLENE 5 0 PS 2 (SUTURE) IMPLANT
SUT PROLENE 6 0 P 1 18 (SUTURE) IMPLANT
SUT VIC AB 5-0 P-3 18X BRD (SUTURE) IMPLANT
SUT VIC AB 5-0 P3 18 (SUTURE)
SUT VIC AB 5-0 PS2 18 (SUTURE) IMPLANT
SUT VICRYL 4-0 PS2 18IN ABS (SUTURE) IMPLANT
SYR BULB 3OZ (MISCELLANEOUS) IMPLANT
SYR CONTROL 10ML LL (SYRINGE) ×3 IMPLANT
TOWEL GREEN STERILE FF (TOWEL DISPOSABLE) ×6 IMPLANT
TRAY DSU PREP LF (CUSTOM PROCEDURE TRAY) ×3 IMPLANT
TUBE CONNECTING 20X1/4 (TUBING) IMPLANT

## 2018-08-30 NOTE — Transfer of Care (Signed)
Immediate Anesthesia Transfer of Care Note  Patient: Samantha Lester  Procedure(s) Performed: EXCISION OF MASS OF CLAVICLE (N/A Chest) BLEPHAROPLASTY (Bilateral Eye)  Patient Location: PACU  Anesthesia Type:General  Level of Consciousness: awake, sedated and patient cooperative  Airway & Oxygen Therapy: Patient Spontanous Breathing and Patient connected to face mask oxygen  Post-op Assessment: Report given to RN and Post -op Vital signs reviewed and stable  Post vital signs: Reviewed and stable  Last Vitals:  Vitals Value Taken Time  BP 134/83 08/30/2018 11:36 AM  Temp    Pulse 86 08/30/2018 11:38 AM  Resp 16 08/30/2018 11:38 AM  SpO2 97 % 08/30/2018 11:38 AM  Vitals shown include unvalidated device data.  Last Pain:  Vitals:   08/30/18 0746  TempSrc: Oral  PainSc: 0-No pain         Complications: No apparent anesthesia complications

## 2018-08-30 NOTE — Anesthesia Postprocedure Evaluation (Signed)
Anesthesia Post Note  Patient: Cyndal Kasson  Procedure(s) Performed: EXCISION OF MASS OF CLAVICLE (N/A Chest) BLEPHAROPLASTY (Bilateral Eye)     Patient location during evaluation: PACU Anesthesia Type: General Level of consciousness: awake and alert Pain management: pain level controlled Vital Signs Assessment: post-procedure vital signs reviewed and stable Respiratory status: spontaneous breathing, nonlabored ventilation, respiratory function stable and patient connected to nasal cannula oxygen Cardiovascular status: blood pressure returned to baseline and stable Postop Assessment: no apparent nausea or vomiting Anesthetic complications: no    Last Vitals:  Vitals:   08/30/18 1211 08/30/18 1226  BP: (!) 130/91 (!) 134/92  Pulse: 72 81  Resp: 11 16  Temp:  36.6 C  SpO2: 96% 98%    Last Pain:  Vitals:   08/30/18 1226  TempSrc: Oral  PainSc: 3                  Tiajuana Amass

## 2018-08-30 NOTE — Anesthesia Preprocedure Evaluation (Signed)
Anesthesia Evaluation  Patient identified by MRN, date of birth, ID band Patient awake    Reviewed: Allergy & Precautions, NPO status , Patient's Chart, lab work & pertinent test results  Airway Mallampati: I  TM Distance: >3 FB Neck ROM: Full    Dental  (+) Dental Advisory Given   Pulmonary neg pulmonary ROS,    breath sounds clear to auscultation       Cardiovascular Exercise Tolerance: Good METS: 7 - 9 Mets hypertension, Pt. on medications (-) angina+ CAD and + Past MI   Rhythm:Regular Rate:Normal     Neuro/Psych negative neurological ROS  negative psych ROS   GI/Hepatic Neg liver ROS, GERD  ,  Endo/Other  Hypothyroidism   Renal/GU negative Renal ROS  negative genitourinary   Musculoskeletal negative musculoskeletal ROS (+)   Abdominal   Peds negative pediatric ROS (+)  Hematology negative hematology ROS (+)   Anesthesia Other Findings   Reproductive/Obstetrics negative OB ROS                             Anesthesia Physical Anesthesia Plan  ASA: III  Anesthesia Plan: General   Post-op Pain Management:    Induction: Intravenous  PONV Risk Score and Plan: 3 and Dexamethasone, Ondansetron and Treatment may vary due to age or medical condition  Airway Management Planned: LMA  Additional Equipment:   Intra-op Plan:   Post-operative Plan: Extubation in OR  Informed Consent: I have reviewed the patients History and Physical, chart, labs and discussed the procedure including the risks, benefits and alternatives for the proposed anesthesia with the patient or authorized representative who has indicated his/her understanding and acceptance.     Dental advisory given  Plan Discussed with: CRNA  Anesthesia Plan Comments:         Anesthesia Quick Evaluation

## 2018-08-30 NOTE — Op Note (Signed)
Operative Note  Date of operation: 08/30/2018  Patient: Samantha Lester, MRN: [id number], 53 y.o. female.  Date of birth:  Dec 06, 1965  Location: [facility]  Preoperative Diagnosis:  1. Redundant Upper Eyelid skin 2. Mass of anterior left neck 3 cm  Postoperative Diagnosis: Same  Procedure:  1. Bilateral Upper eyelid blepharoplasty 2. Excision of anterior left neck mass  Surgeon: Theodoro Kos Jeanna Giuffre, DO  Assistant: Ronette Deter, PA  Anesthesia: 1% lidocaine with epinepherine  Condition: Stable  Complications: None   EBL: Minimal  Disposition: Recovery Room  Indications: To prevent complications and improve visual field function.  Improve quality of life.  The patient presented with concerns regarding heavy upper eyelids with a decrease visual field.  This made it difficult with activities and reading.  It was better with manual elevation of the upper eyelids or extending the neck.  The clinical exam and tests confirmed the need for bilateral upper eyelid blepharoplasty.  Surgical risks and benefits were discussed in detail and are in the chart.  Procedure in detail: Patient was seen on the morning of the procedure after anesthesia evaluation. The patient was marked in holding in the sitting and reclining position. All questions were answered.  The patient was then taken to the operating room.  Anesthesia was administered and a time out was called with all information confirmed to be correct.  The patient was prepped and draped with a betadine prep. The markings were confirmed.  Local anesthetic consisting of 1% lidocaine with 1:100,000 units of epinephrine was used. This was injected into the upper lids. The local was given 6 minutes to take effect.  Attention was directed to the right upper lid.  The redundant upper eyelid was measured with 20 mm of upper eyelid skin to remain.  A # 15 blade was used to incise the upper eyelid skin.  A small strip of orbicularis  muscle was taken at the crease.  Hemostasis was achieved with the bipolar.  The dissection was carried down past the orbicularis and expose the fat.  A small amount of fat was excised and cauterized in the medial compartment of the upper lid. The incisions were closed with 6-0 Monocryl as a running subcuticular and several interrupted sutures placed laterally.  Left upper lid.  The redundant upper eyelid was measured with 20 mm of upper eyelid skin to remain.  A # 15 blade was used to incise the upper eyelid skin.  A small strip of orbicularis muscle was taken at the crease.  Hemostasis was achieved with electrocautery.  The dissection was carried down past the orbicularis and expose the fat.  A small amount of fat was excised and cauterized in the medial compartment of the upper lid. The incisions were closed with 6-0 Monocryl as a running subcuticular and several interrupted sutures placed laterally.   Ice was applied.  Excision of left neck mass:  The skin over the mass was injected with local.  The #15 blade was used to make a 2 cm incision within the natural lines of the neck.  The hemostat was used to dissect to the mass 3 cm.  The mass was located and appeared to be all fat in nature.  It was removed completely.  Hemostasis was achieved with electrocautery.  The deep layers were closed with the 5-0 Monocryl followed by a running 5-0 Monocryl.  Derma bond and steri strips were applied.  The patient was allowed to wake up and was taken to the recovery  room with no apparent complications.   The advanced practice practitioner (APP) assisted throughout the case.  The APP was essential in retraction and counter traction when needed to make the case progress smoothly.  This retraction and assistance made it possible to see the tissue plans for the procedure.  The assistance was needed for blood control, tissue re-approximation and assisted with closure of the incision site.

## 2018-08-30 NOTE — Discharge Instructions (Addendum)
°  Post Anesthesia Home Care Instructions  Activity: Get plenty of rest for the remainder of the day. A responsible individual must stay with you for 24 hours following the procedure.  For the next 24 hours, DO NOT: -Drive a car -Paediatric nurse -Drink alcoholic beverages -Take any medication unless instructed by your physician -Make any legal decisions or sign important papers.  Meals: Start with liquid foods such as gelatin or soup. Progress to regular foods as tolerated. Avoid greasy, spicy, heavy foods. If nausea and/or vomiting occur, drink only clear liquids until the nausea and/or vomiting subsides. Call your physician if vomiting continues.  Special Instructions/Symptoms: Your throat may feel dry or sore from the anesthesia or the breathing tube placed in your throat during surgery. If this causes discomfort, gargle with warm salt water. The discomfort should disappear within 24 hours.  If you had a scopolamine patch placed behind your ear for the management of post- operative nausea and/or vomiting:  1. The medication in the patch is effective for 72 hours, after which it should be removed.  Wrap patch in a tissue and discard in the trash. Wash hands thoroughly with soap and water. 2. You may remove the patch earlier than 72 hours if you experience unpleasant side effects which may include dry mouth, dizziness or visual disturbances. 3. Avoid touching the patch. Wash your hands with soap and water after contact with the patch.      Call your surgeon if you experience:   1.  Fever over 101.0. 2.  Inability to urinate. 3.  Nausea and/or vomiting. 4.  Extreme swelling or bruising at the surgical site. 5.  Continued bleeding from the incision. 6.  Increased pain, redness or drainage from the incision. 7.  Problems related to your pain medication. 8.  Any problems and/or concerns   Head of bed elevated. Cold packs to eyes for next 24 hours Nothing to neck. May shower  tomorrow.

## 2018-08-30 NOTE — Anesthesia Procedure Notes (Signed)
Procedure Name: LMA Insertion Date/Time: 08/30/2018 10:05 AM Performed by: Lyndee Leo, CRNA Pre-anesthesia Checklist: Patient identified, Emergency Drugs available, Suction available and Patient being monitored Patient Re-evaluated:Patient Re-evaluated prior to induction Oxygen Delivery Method: Circle system utilized Preoxygenation: Pre-oxygenation with 100% oxygen Induction Type: IV induction Ventilation: Mask ventilation without difficulty LMA: LMA flexible inserted LMA Size: 4.0 Number of attempts: 1 Airway Equipment and Method: Bite block Placement Confirmation: positive ETCO2 Tube secured with: Tape Dental Injury: Teeth and Oropharynx as per pre-operative assessment

## 2018-08-30 NOTE — Interval H&P Note (Signed)
History and Physical Interval Note:  08/30/2018 9:56 AM  Samantha Lester  has presented today for surgery, with the diagnosis of Mass of sternum, and Dermatochalasis of both upper eyelids  The various methods of treatment have been discussed with the patient and family. After consideration of risks, benefits and other options for treatment, the patient has consented to  Procedure(s) with comments: EXCISION OF MASS OF CLAVICLE (N/A) - 2 hours total for both procedures BLEPHAROPLASTY (Bilateral) as a surgical intervention .  The patient's history has been reviewed, patient examined, no change in status, stable for surgery.  I have reviewed the patient's chart and labs.  Questions were answered to the patient's satisfaction.     [name]

## 2018-08-31 ENCOUNTER — Ambulatory Visit: Payer: No Typology Code available for payment source

## 2018-08-31 ENCOUNTER — Encounter (HOSPITAL_BASED_OUTPATIENT_CLINIC_OR_DEPARTMENT_OTHER): Payer: Self-pay | Admitting: Plastic Surgery

## 2018-08-31 ENCOUNTER — Other Ambulatory Visit: Payer: No Typology Code available for payment source

## 2018-09-07 ENCOUNTER — Encounter: Payer: Self-pay | Admitting: Plastic Surgery

## 2018-09-07 ENCOUNTER — Ambulatory Visit (INDEPENDENT_AMBULATORY_CARE_PROVIDER_SITE_OTHER): Payer: No Typology Code available for payment source | Admitting: Plastic Surgery

## 2018-09-07 VITALS — BP 123/78 | HR 76 | Temp 98.4°F | Ht 61.0 in | Wt 166.8 lb

## 2018-09-07 DIAGNOSIS — H02834 Dermatochalasis of left upper eyelid: Secondary | ICD-10-CM

## 2018-09-07 DIAGNOSIS — M898X8 Other specified disorders of bone, other site: Secondary | ICD-10-CM

## 2018-09-07 DIAGNOSIS — D171 Benign lipomatous neoplasm of skin and subcutaneous tissue of trunk: Secondary | ICD-10-CM

## 2018-09-07 DIAGNOSIS — H02831 Dermatochalasis of right upper eyelid: Secondary | ICD-10-CM

## 2018-09-07 MED FILL — AMLODIPINE BESYLATE 5 MG TA: 5 | 90 days supply | Qty: 90 | Fill #2

## 2018-09-07 NOTE — Progress Notes (Signed)
   Subjective:    Patient ID: Samantha Lester, female    DOB: Feb 25, 1966, 53 y.o.   MRN: [id number]  The patient is a 53 year old female here with her mom for follow-up after surgery.  She had an upper lid blepharoplasty.  She had quite a bit of bruising.  No sign of infection.  Incisions healing well.  The mass from the sternum was excised and pathology showed it to be a lipoma.  She said it is a little bit tender.  No sign of infection.  Incision healing well.  She is very pleased with the results so far.   Review of Systems  Constitutional: Negative.   HENT: Negative.   Cardiovascular: Negative.   Musculoskeletal: Negative.   Skin: Positive for color change.       Objective:   Physical Exam Vitals signs and nursing note reviewed.  HENT:     Head: Normocephalic.     Nose: Nose normal.     Mouth/Throat:     Mouth: Mucous membranes are moist.  Cardiovascular:     Rate and Rhythm: Normal rate.     Pulses: Normal pulses.  Neurological:     Mental Status: She is alert.  Psychiatric:        Mood and Affect: Mood normal.        Thought Content: Thought content normal.        Judgment: Judgment normal.         Assessment & Plan:  Mass of sternum  Dermatochalasis of both upper eyelids  Lipoma of chest wall  Follow-up 3 weeks.  I will be seeing her back when she brings in her grandchild.

## 2018-09-08 MED FILL — ESCITALOPRAM 10 MG TABLET: 10 | 90 days supply | Qty: 90 | Fill #0

## 2018-10-04 ENCOUNTER — Other Ambulatory Visit: Payer: No Typology Code available for payment source

## 2018-10-21 ENCOUNTER — Ambulatory Visit: Payer: No Typology Code available for payment source

## 2018-10-21 ENCOUNTER — Other Ambulatory Visit: Payer: No Typology Code available for payment source

## 2018-11-01 ENCOUNTER — Other Ambulatory Visit: Payer: No Typology Code available for payment source

## 2018-11-09 NOTE — Progress Notes (Signed)
Virtual Visit via Video Note   This visit type was conducted due to national recommendations for restrictions regarding the COVID-19 Pandemic (e.g. social distancing) in an effort to limit this patient's exposure and mitigate transmission in our community.  Due to her co-morbid illnesses, this patient is at least at moderate risk for complications without adequate follow up.  This format is felt to be most appropriate for this patient at this time.  All issues noted in this document were discussed and addressed.  A limited physical exam was performed with this format.  Please refer to the patient's chart for her consent to telehealth for [facility].   Date:  11/10/2018   ID:  Samantha Lester, DOB 09/05/1965, MRN [id number]  Patient Location: Home Provider Location: Home  PCP:  Tisovec, Fransico Him, MD  Cardiologist:  Kirk Ruths, MD   Evaluation Performed:  Follow-Up Visit  Chief Complaint:  FU CAD  History of Present Illness:    FU CAD. Admitted 7/16 with NSTEMI. Echo 7/16 showed normal LV function and mild MR. 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 EF 40-45. CVTS saw patient but felt CABG not indicated. Patient treated medically. FU cath 01/12/15 showed improvement of dissection with residual 75 OM1 and 80 distal Lcx. Since last seen,patient has minimal dyspnea on exertion.  She occasionally has vague pain in her chest but feels this may be related to either indigestion or stress from family situation.  It is unlike her previous infarct pain.  It occurs at rest and is not pleuritic or positional.  Not related to food.  She does not have exertional chest pain.  There is no syncope.  The patient does not have symptoms concerning for COVID-19 infection (fever, chills, cough, or new shortness of breath).    Past Medical History:  Diagnosis Date  . Anxiety   . Complication of anesthesia   . Coronary artery dissection   .  GERD (gastroesophageal reflux disease)   . Heart attack ([place]) 2016  . Hyperlipidemia   . Hypertension   . Hypothyroidism   . PONV (postoperative nausea and vomiting)   . Thyroid disease    Past Surgical History:  Procedure Laterality Date  . BROW LIFT Bilateral 08/30/2018   Procedure: BLEPHAROPLASTY;  Surgeon: Wallace Going, DO;  Location: [place];  Service: Plastics;  Laterality: Bilateral;  . CARDIAC CATHETERIZATION N/A 01/09/2015   Procedure: Left Heart Cath and Coronary Angiography;  Surgeon: Peter M Martinique, MD;  Location: [place] [facility];  Service: Cardiovascular;  Laterality: N/A;  . CARDIAC CATHETERIZATION N/A 01/12/2015   Procedure: Left Heart Cath and Coronary Angiography;  Surgeon: Sherren Mocha, MD;  Location: [place] [facility];  Service: Cardiovascular;  Laterality: N/A;  . MASS EXCISION N/A 08/30/2018   Procedure: EXCISION OF MASS OF CLAVICLE;  Surgeon: Wallace Going, DO;  Location: [place];  Service: Plastics;  Laterality: N/A;  2 hours total for both procedures  . TUBAL LIGATION     had reversal 1998  . tubal ligation reversal  1998     Current Meds  Medication Sig  . amLODipine (NORVASC) 5 MG tablet Take 1 tablet by mouth daily.  Marland Kitchen atorvastatin (LIPITOR) 80 MG tablet Take 1 tablet (80 mg total) by mouth daily.  Marland Kitchen escitalopram (LEXAPRO) 10 MG tablet Take 1 tablet daily by mouth.  . hydrochlorothiazide (HYDRODIURIL) 25 MG tablet Take 25 mg by  mouth daily.  Marland Kitchen levothyroxine (SYNTHROID, LEVOTHROID) 50 MCG tablet Take 1 tablet (50 mcg total) by mouth daily.  Marland Kitchen lisinopril (PRINIVIL,ZESTRIL) 40 MG tablet Take 1 tablet (40 mg total) by mouth daily.  . nitroGLYCERIN (NITROSTAT) 0.4 MG SL tablet Place 1 tablet (0.4 mg total) under the tongue every 5 (five) minutes x 3 doses as needed for chest pain.     Allergies:   Molds & smuts   Social History   Tobacco Use  . Smoking status: Never Smoker  . Smokeless tobacco: Never Used   Substance Use Topics  . Alcohol use: Yes    Alcohol/week: 0.0 standard drinks    Comment: occasionally   . Drug use: No     Family Hx: The patient's family history includes Alcohol abuse in her brother; Drug abuse in her brother; Healthy in her father and sister; Hypertension in her brother, mother, and sister; Kidney disease in her sister. There is no history of Colon cancer.  ROS:   Please see the history of present illness.    No fevers, chills or productive cough. All other systems reviewed and are negative.  Recent Labs: 01/22/2018: BUN 16; Creatinine, Ser 0.60; Hemoglobin 15.0; Potassium 3.5; Sodium 141 08/24/2018: ALT 18   Recent Lipid Panel Lab Results  Component Value Date/Time   CHOL 202 (H) 08/24/2018 12:22 PM   TRIG 109 08/24/2018 12:22 PM   HDL 50 08/24/2018 12:22 PM   CHOLHDL 4.0 08/24/2018 12:22 PM   CHOLHDL 3.5 02/22/2015 10:40 AM   LDLCALC 130 (H) 08/24/2018 12:22 PM    Wt Readings from Last 3 Encounters:  11/10/18 167 lb (75.8 kg)  09/07/18 166 lb 12.8 oz (75.7 kg)  08/30/18 168 lb 10.4 oz (76.5 kg)     Objective:    Vital Signs:  BP 138/84   Pulse 71   Ht 5\' 1"  (1.549 m)   Wt 167 lb (75.8 kg)   LMP 07/07/1997 (Approximate) Comment: BTL  BMI 31.55 kg/m    VITAL SIGNS:  reviewed  No acute distress Answers questions appropriately Normal affect Remainder of physical examination not performed (telehealth visit; coronavirus pandemic)  ASSESSMENT & PLAN:    1. Coronary artery disease-patient has had a previous coronary artery dissection.  Plan to continue medical therapy with statin.  Resume aspirin 81 mg daily.  She has occasional vague chest pain unlike her previous infarct pain.  We will arrange a [place] nuclear study for risk stratification. 2. Hypertension-blood pressure mildly elevated.  However she states typically controlled.  We will continue to follow and increase medications as needed.  Could increase amlodipine as next step if her blood  pressure increases. 3. Hyperlipidemia-continue statin.  Check lipids and liver. 4. Ischemic cardiomyopathy-continue ACE inhibitor at present dose.  Patient has not tolerated beta-blockers in the past.  Repeat echocardiogram.  COVID-19 Education: The importance of social distancing was discussed today.  Time:   Today, I have spent 20 minutes with the patient with telehealth technology discussing the above problems.     Medication Adjustments/Labs and Tests Ordered: Current medicines are reviewed at length with the patient today.  Concerns regarding medicines are outlined above.   Tests Ordered: No orders of the defined types were placed in this encounter.   Medication Changes: No orders of the defined types were placed in this encounter.   Disposition:  Follow up in 3 month(s)  Signed, Kirk Ruths, MD  11/10/2018 3:10 PM    [facility]

## 2018-11-10 ENCOUNTER — Telehealth (INDEPENDENT_AMBULATORY_CARE_PROVIDER_SITE_OTHER): Payer: No Typology Code available for payment source | Admitting: Cardiology

## 2018-11-10 ENCOUNTER — Encounter: Payer: Self-pay | Admitting: *Deleted

## 2018-11-10 VITALS — BP 138/84 | HR 71 | Ht 61.0 in | Wt 167.0 lb

## 2018-11-10 DIAGNOSIS — I255 Ischemic cardiomyopathy: Secondary | ICD-10-CM

## 2018-11-10 DIAGNOSIS — I2510 Atherosclerotic heart disease of native coronary artery without angina pectoris: Secondary | ICD-10-CM | POA: Diagnosis not present

## 2018-11-10 DIAGNOSIS — I10 Essential (primary) hypertension: Secondary | ICD-10-CM

## 2018-11-10 DIAGNOSIS — R072 Precordial pain: Secondary | ICD-10-CM

## 2018-11-10 DIAGNOSIS — E7800 Pure hypercholesterolemia, unspecified: Secondary | ICD-10-CM

## 2018-11-10 MED ORDER — ASPIRIN EC 81 MG PO TBEC: 81.0000 mg | DELAYED_RELEASE_TABLET | Freq: Every day | ORAL | 3 refills | Status: AC

## 2018-11-10 NOTE — Patient Instructions (Signed)
Medication Instructions:  START ASPIRIN 81 MG ONCE DAILY If you need a refill on your cardiac medications before your next appointment, please call your pharmacy.   Lab work: Your physician recommends that you return for lab work in: [place] If you have labs (blood work) drawn today and your tests are completely normal, you will receive your results only by: Marland Kitchen MyChart Message (if you have MyChart) OR . A paper copy in the mail If you have any lab test that is abnormal or we need to change your treatment, we will call you to review the results.  Testing/Procedures: Your physician has requested that you have an echocardiogram. Echocardiography is a painless test that uses sound waves to create images of your heart. It provides your doctor with information about the size and shape of your heart and how well your heart's chambers and valves are working. This procedure takes approximately one hour. There are no restrictions for this procedure.  [place] has requested that you have a lexiscan myoview. For further information please visit [email or web address]. Please follow instruction sheet, as given.  [place]  Follow-Up: At [facility], you and your health needs are our priority.  As part of our continuing mission to provide you with exceptional heart care, we have created designated Provider Care Teams.  These Care Teams include your primary Cardiologist (physician) and Advanced Practice Providers (APPs -  Physician Assistants and Nurse Practitioners) who all work together to provide you with the care you need, when you need it. Your physician recommends that you schedule a follow-up appointment in:  [place]

## 2018-11-11 MED FILL — HYDROCHLOROTHIAZIDE 25 MG T: 25 | 90 days supply | Qty: 90 | Fill #3

## 2018-11-11 MED FILL — LEVOTHYROXINE 50 MCG TABLET: 50 | 90 days supply | Qty: 90 | Fill #1

## 2018-12-02 LAB — HEPATIC FUNCTION PANEL
ALT: 38 IU/L — ABNORMAL HIGH (ref 0–32)
AST: 27 IU/L (ref 0–40)
Albumin: 4.5 g/dL (ref 3.8–4.9)
Alkaline Phosphatase: 77 IU/L (ref 39–117)
Bilirubin Total: 0.3 mg/dL (ref 0.0–1.2)
Bilirubin, Direct: 0.08 mg/dL (ref 0.00–0.40)
Total Protein: 7 g/dL (ref 6.0–8.5)

## 2018-12-02 LAB — LIPID PANEL
Chol/HDL Ratio: 3.4 ratio (ref 0.0–4.4)
Cholesterol, Total: 142 mg/dL (ref 100–199)
HDL: 42 mg/dL (ref >39)
LDL Calculated: 70 mg/dL (ref 0–99)
Triglycerides: 149 mg/dL (ref 0–149)
VLDL Cholesterol Cal: 30 mg/dL (ref 5–40)

## 2018-12-03 MED FILL — ATORVASTATIN 80 MG TABLET: 80 | 90 days supply | Qty: 90 | Fill #1

## 2018-12-14 ENCOUNTER — Ambulatory Visit
Admission: RE | Admit: 2018-12-14 | Discharge: 2018-12-14 | Disposition: A | Discharge status: Home / Self Care | Payer: No Typology Code available for payment source | Source: Ambulatory Visit | Attending: Obstetrics and Gynecology | Admitting: Obstetrics and Gynecology

## 2018-12-14 ENCOUNTER — Other Ambulatory Visit: Payer: Self-pay

## 2018-12-14 DIAGNOSIS — Z1231 Encounter for screening mammogram for malignant neoplasm of breast: Secondary | ICD-10-CM

## 2018-12-14 DIAGNOSIS — M8580 Other specified disorders of bone density and structure, unspecified site: Secondary | ICD-10-CM

## 2018-12-15 ENCOUNTER — Other Ambulatory Visit: Payer: Self-pay | Admitting: Obstetrics and Gynecology

## 2018-12-15 DIAGNOSIS — R921 Mammographic calcification found on diagnostic imaging of breast: Secondary | ICD-10-CM

## 2018-12-16 ENCOUNTER — Other Ambulatory Visit: Payer: Self-pay | Admitting: Cardiology

## 2018-12-16 ENCOUNTER — Telehealth (HOSPITAL_COMMUNITY): Payer: Self-pay

## 2018-12-16 MED FILL — AMLODIPINE BESYLATE 5 MG TA: 5 | 90 days supply | Qty: 90 | Fill #0

## 2018-12-16 NOTE — Telephone Encounter (Signed)
Spoke with the patient's husband. He stated he would give her all the instructions and to call back with any questions. S.Unnamed Zeien EMTP

## 2018-12-20 ENCOUNTER — Ambulatory Visit
Admission: RE | Admit: 2018-12-20 | Discharge: 2018-12-20 | Disposition: A | Discharge status: Home / Self Care | Payer: No Typology Code available for payment source | Source: Ambulatory Visit | Attending: Obstetrics and Gynecology | Admitting: Obstetrics and Gynecology

## 2018-12-20 ENCOUNTER — Telehealth (HOSPITAL_COMMUNITY): Payer: Self-pay | Admitting: *Deleted

## 2018-12-20 ENCOUNTER — Other Ambulatory Visit: Payer: Self-pay

## 2018-12-20 DIAGNOSIS — R921 Mammographic calcification found on diagnostic imaging of breast: Secondary | ICD-10-CM

## 2018-12-20 NOTE — Telephone Encounter (Signed)
COVID-19 Pre-Screening Questions:  . Do you currently have a fever? NO (yes = cancel and refer to pcp for e-visit) . Have you recently travelled on a cruise, internationally, or to [place], [place], [place], [place], [place], or [place], [place] [employer]) ? No (yes = cancel, stay home, monitor symptoms, and contact pcp or initiate e-visit if symptoms develop) . Have you been in contact with someone that is currently pending confirmation of Covid19 testing or has been confirmed to have the [place] virus?  NO (yes = cancel, stay home, away from tested individual, monitor symptoms, and contact pcp or initiate e-visit if symptoms develop) . Are you currently experiencing fatigue or cough? NO (yes = pt should be prepared to have a mask placed at the time of their visit).   . Reiterated no additional visitors. [name] no earlier than 15 minutes before appointment time. . Please bring own mask.  Samantha Lester

## 2018-12-21 ENCOUNTER — Ambulatory Visit (HOSPITAL_BASED_OUTPATIENT_CLINIC_OR_DEPARTMENT_OTHER): Payer: No Typology Code available for payment source

## 2018-12-21 ENCOUNTER — Ambulatory Visit (HOSPITAL_COMMUNITY): Payer: No Typology Code available for payment source | Attending: Internal Medicine

## 2018-12-21 DIAGNOSIS — R072 Precordial pain: Secondary | ICD-10-CM

## 2018-12-21 LAB — MYOCARDIAL PERFUSION IMAGING
LV dias vol: 62 mL (ref 46–106)
LV sys vol: 24 mL
Peak HR: 96 {beats}/min
Rest HR: 55 {beats}/min
SDS: 1
SRS: 0
SSS: 1
TID: 0.86

## 2018-12-21 MED ORDER — TECHNETIUM TC 99M TETROFOSMIN IV KIT
31.9000 | PACK | Freq: Once | INTRAVENOUS | Status: AC | PRN
  Administered 2018-12-21: 31.9 via INTRAVENOUS
  Filled 2018-12-21: qty 32

## 2018-12-21 MED ORDER — REGADENOSON 0.4 MG/5ML IV SOLN
0.4000 mg | Freq: Once | INTRAVENOUS | Status: AC
  Administered 2018-12-21: 0.4 mg via INTRAVENOUS

## 2018-12-21 MED ORDER — TECHNETIUM TC 99M TETROFOSMIN IV KIT
10.1000 | PACK | Freq: Once | INTRAVENOUS | Status: AC | PRN
  Administered 2018-12-21: 10.1 via INTRAVENOUS
  Filled 2018-12-21: qty 11

## 2018-12-27 MED FILL — ESCITALOPRAM 10 MG TABLET: 10 | 90 days supply | Qty: 90 | Fill #1

## 2019-01-27 ENCOUNTER — Ambulatory Visit
Admission: RE | Admit: 2019-01-27 | Discharge: 2019-01-27 | Disposition: A | Discharge status: Home / Self Care | Payer: No Typology Code available for payment source | Source: Ambulatory Visit

## 2019-01-27 NOTE — ED Provider Notes (Signed)
Patient was physically present in [place] during E visit so unable to complete E visit    Orvan July, NP 01/27/19 [phone]

## 2019-02-21 NOTE — Progress Notes (Deleted)
HPI: FU CAD. Admitted 7/16 with NSTEMI. Echo 7/16 showed normal LV function and mild MR. 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 EF 40-45. CVTS saw patient but felt CABG not indicated. Patient treated medically. FU cath 01/12/15 showed improvement of dissection with residual 75 OM1 and 80 distal Lcx.  Nuclear study June 2020 showed ejection fraction 62% with no ischemia.  Echo June 2020 showed normal LV function and trace aortic insufficiency.  Since last seen,  Current Outpatient Medications  Medication Sig Dispense Refill  . amLODipine (NORVASC) 5 MG tablet TAKE 1 TABLET BY MOUTH ONCE DAILY 90 tablet 3  . aspirin EC 81 MG tablet Take 1 tablet (81 mg total) by mouth daily. 90 tablet 3  . atorvastatin (LIPITOR) 80 MG tablet Take 1 tablet (80 mg total) by mouth daily. 90 tablet 2  . escitalopram (LEXAPRO) 10 MG tablet Take 1 tablet daily by mouth.  1  . hydrochlorothiazide (HYDRODIURIL) 25 MG tablet Take 25 mg by mouth daily.    Marland Kitchen levothyroxine (SYNTHROID, LEVOTHROID) 50 MCG tablet Take 1 tablet (50 mcg total) by mouth daily. 30 tablet 0  . lisinopril (PRINIVIL,ZESTRIL) 40 MG tablet Take 1 tablet (40 mg total) by mouth daily. 90 tablet 2  . nitroGLYCERIN (NITROSTAT) 0.4 MG SL tablet Place 1 tablet (0.4 mg total) under the tongue every 5 (five) minutes x 3 doses as needed for chest pain. 25 tablet 12   No current facility-administered medications for this visit.      Past Medical History:  Diagnosis Date  . Anxiety   . Complication of anesthesia   . Coronary artery dissection   . GERD (gastroesophageal reflux disease)   . Heart attack ([place]) 2016  . Hyperlipidemia   . Hypertension   . Hypothyroidism   . PONV (postoperative nausea and vomiting)   . Thyroid disease     Past Surgical History:  Procedure Laterality Date  . BROW LIFT Bilateral 08/30/2018   Procedure: BLEPHAROPLASTY;  Surgeon: Wallace Going,  DO;  Location: [place];  Service: Plastics;  Laterality: Bilateral;  . CARDIAC CATHETERIZATION N/A 01/09/2015   Procedure: Left Heart Cath and Coronary Angiography;  Surgeon: Peter M Martinique, MD;  Location: [place] [facility];  Service: Cardiovascular;  Laterality: N/A;  . CARDIAC CATHETERIZATION N/A 01/12/2015   Procedure: Left Heart Cath and Coronary Angiography;  Surgeon: Sherren Mocha, MD;  Location: [place] [facility];  Service: Cardiovascular;  Laterality: N/A;  . MASS EXCISION N/A 08/30/2018   Procedure: EXCISION OF MASS OF CLAVICLE;  Surgeon: Wallace Going, DO;  Location: [place];  Service: Plastics;  Laterality: N/A;  2 hours total for both procedures  . TUBAL LIGATION     had reversal 1998  . tubal ligation reversal  1998    Social History   Socioeconomic History  . Marital status: Married    Spouse name: Not on file  . Number of children: Not on file  . Years of education: Not on file  . Highest education level: Not on file  Occupational History  . Not on file  Social Needs  . Financial resource strain: Not on file  . Food insecurity    Worry: Not on file    Inability: Not on file  . Transportation needs    Medical: Not on file    Non-medical: Not on file  Tobacco Use  .  Smoking status: Never Smoker  . Smokeless tobacco: Never Used  Substance and Sexual Activity  . Alcohol use: Yes    Alcohol/week: 0.0 standard drinks    Comment: occasionally   . Drug use: No  . Sexual activity: Yes    Partners: Male    Birth control/protection: Post-menopausal    Comment: BTL  Lifestyle  . Physical activity    Days per week: Not on file    Minutes per session: Not on file  . Stress: Not on file  Relationships  . Social Herbalist on phone: Not on file    Gets together: Not on file    Attends religious service: Not on file    Active member of club or organization: Not on file    Attends meetings of clubs or organizations:  Not on file    Relationship status: Not on file  . Intimate partner violence    Fear of current or ex partner: Not on file    Emotionally abused: Not on file    Physically abused: Not on file    Forced sexual activity: Not on file  Other Topics Concern  . Not on file  Social History Narrative  . Not on file    Family History  Problem Relation Age of Onset  . Hypertension Mother   . Healthy Father   . Kidney disease Sister   . Hypertension Sister   . Healthy Sister   . Drug abuse Brother   . Alcohol abuse Brother   . Hypertension Brother   . Colon cancer Neg Hx     ROS: no fevers or chills, productive cough, hemoptysis, dysphasia, odynophagia, melena, hematochezia, dysuria, hematuria, rash, seizure activity, orthopnea, PND, pedal edema, claudication. Remaining systems are negative.  Physical Exam: Well-developed well-nourished in no acute distress.  Skin is warm and dry.  HEENT is normal.  Neck is supple.  Chest is clear to auscultation with normal expansion.  Cardiovascular exam is regular rate and rhythm.  Abdominal exam nontender or distended. No masses palpated. Extremities show no edema. neuro grossly intact  ECG- personally reviewed  A/P  1 coronary artery disease-previous spontaneous coronary artery dissection.  No recurrent chest pain.  Continue aspirin and statin.  2 hypertension-patient's blood pressure is controlled.  Continue present medications and follow.  3 hyperlipidemia-continue statin.  4 ischemic cardiomyopathy-follow-up echocardiogram showed preserved LV function.  Continue ACE inhibitor.  No beta-blocker as she has not tolerated in the past.  Kirk Ruths, MD

## 2019-02-28 ENCOUNTER — Ambulatory Visit: Payer: No Typology Code available for payment source | Admitting: Cardiology

## 2019-03-04 ENCOUNTER — Telehealth: Payer: Self-pay | Admitting: *Deleted

## 2019-03-04 NOTE — Telephone Encounter (Signed)
12/14/18: Screening MMG at [facility]: right breast calcifications, placed in MMG hold for f/u.    12/20/18: Dx MMG right breast, bi-rads 2, bilateral screening MMG recommended in 1 year.  Dr. Talbert Nan -ok to remove from [facility] hold?

## 2019-03-07 NOTE — Telephone Encounter (Signed)
Yes, please take her out of mammogram hold.

## 2019-03-07 NOTE — Telephone Encounter (Signed)
Patient removed from MMG hold.   Encounter closed.  

## 2019-06-22 ENCOUNTER — Ambulatory Visit: Payer: No Typology Code available for payment source | Admitting: Obstetrics and Gynecology

## 2021-08-27 ENCOUNTER — Encounter: Payer: Self-pay | Admitting: Gastroenterology
